# Patient Record
Sex: Female | Born: 1935 | Race: Black or African American | Hispanic: No
Health system: Southern US, Community
[De-identification: ages and names within clinical notes are randomized; demographics above are authoritative.]

---

## 2000-01-20 ENCOUNTER — Inpatient Hospital Stay (HOSPITAL_COMMUNITY): Admission: EM | Admit: 2000-01-20 | Discharge: 2000-01-22 | Payer: Self-pay | Admitting: Cardiology

## 2000-01-21 ENCOUNTER — Encounter: Payer: Self-pay | Admitting: Cardiology

## 2001-02-09 ENCOUNTER — Emergency Department (HOSPITAL_COMMUNITY): Admission: EM | Admit: 2001-02-09 | Discharge: 2001-02-09 | Payer: Self-pay | Admitting: *Deleted

## 2001-05-09 ENCOUNTER — Encounter: Payer: Self-pay | Admitting: Internal Medicine

## 2001-05-09 ENCOUNTER — Emergency Department (HOSPITAL_COMMUNITY): Admission: EM | Admit: 2001-05-09 | Discharge: 2001-05-09 | Payer: Self-pay | Admitting: Internal Medicine

## 2001-07-07 ENCOUNTER — Emergency Department (HOSPITAL_COMMUNITY): Admission: EM | Admit: 2001-07-07 | Discharge: 2001-07-07 | Payer: Self-pay | Admitting: Emergency Medicine

## 2001-07-09 ENCOUNTER — Encounter: Payer: Self-pay | Admitting: Family Medicine

## 2001-07-09 ENCOUNTER — Ambulatory Visit (HOSPITAL_COMMUNITY): Admission: RE | Admit: 2001-07-09 | Discharge: 2001-07-09 | Payer: Self-pay | Admitting: Family Medicine

## 2001-09-22 ENCOUNTER — Encounter: Payer: Self-pay | Admitting: *Deleted

## 2001-09-22 ENCOUNTER — Emergency Department (HOSPITAL_COMMUNITY): Admission: EM | Admit: 2001-09-22 | Discharge: 2001-09-22 | Payer: Self-pay | Admitting: *Deleted

## 2001-10-22 ENCOUNTER — Encounter: Payer: Self-pay | Admitting: *Deleted

## 2001-10-22 ENCOUNTER — Emergency Department (HOSPITAL_COMMUNITY): Admission: EM | Admit: 2001-10-22 | Discharge: 2001-10-22 | Payer: Self-pay | Admitting: Emergency Medicine

## 2001-10-22 ENCOUNTER — Emergency Department (HOSPITAL_COMMUNITY): Admission: EM | Admit: 2001-10-22 | Discharge: 2001-10-22 | Payer: Self-pay | Admitting: *Deleted

## 2001-11-06 ENCOUNTER — Emergency Department (HOSPITAL_COMMUNITY): Admission: EM | Admit: 2001-11-06 | Discharge: 2001-11-06 | Payer: Self-pay | Admitting: *Deleted

## 2001-11-08 ENCOUNTER — Emergency Department (HOSPITAL_COMMUNITY): Admission: EM | Admit: 2001-11-08 | Discharge: 2001-11-08 | Payer: Self-pay | Admitting: Emergency Medicine

## 2001-12-26 ENCOUNTER — Emergency Department (HOSPITAL_COMMUNITY): Admission: EM | Admit: 2001-12-26 | Discharge: 2001-12-27 | Payer: Self-pay | Admitting: *Deleted

## 2001-12-27 ENCOUNTER — Encounter: Payer: Self-pay | Admitting: *Deleted

## 2002-11-04 ENCOUNTER — Encounter: Payer: Self-pay | Admitting: Internal Medicine

## 2002-11-04 ENCOUNTER — Emergency Department (HOSPITAL_COMMUNITY): Admission: EM | Admit: 2002-11-04 | Discharge: 2002-11-04 | Payer: Self-pay | Admitting: Internal Medicine

## 2002-11-16 ENCOUNTER — Encounter: Payer: Self-pay | Admitting: Family Medicine

## 2002-11-16 ENCOUNTER — Ambulatory Visit (HOSPITAL_COMMUNITY): Admission: RE | Admit: 2002-11-16 | Discharge: 2002-11-16 | Payer: Self-pay | Admitting: Family Medicine

## 2002-11-24 ENCOUNTER — Encounter: Payer: Self-pay | Admitting: Emergency Medicine

## 2002-11-24 ENCOUNTER — Emergency Department (HOSPITAL_COMMUNITY): Admission: EM | Admit: 2002-11-24 | Discharge: 2002-11-24 | Payer: Self-pay | Admitting: Emergency Medicine

## 2003-01-08 ENCOUNTER — Encounter: Payer: Self-pay | Admitting: Emergency Medicine

## 2003-01-08 ENCOUNTER — Emergency Department (HOSPITAL_COMMUNITY): Admission: EM | Admit: 2003-01-08 | Discharge: 2003-01-08 | Payer: Self-pay | Admitting: Emergency Medicine

## 2003-03-30 ENCOUNTER — Ambulatory Visit (HOSPITAL_COMMUNITY): Admission: RE | Admit: 2003-03-30 | Discharge: 2003-03-31 | Payer: Self-pay | Admitting: Ophthalmology

## 2003-06-09 ENCOUNTER — Ambulatory Visit (HOSPITAL_COMMUNITY): Admission: RE | Admit: 2003-06-09 | Discharge: 2003-06-09 | Payer: Self-pay | Admitting: Ophthalmology

## 2003-08-26 ENCOUNTER — Emergency Department (HOSPITAL_COMMUNITY): Admission: EM | Admit: 2003-08-26 | Discharge: 2003-08-26 | Payer: Self-pay | Admitting: Internal Medicine

## 2003-11-07 ENCOUNTER — Emergency Department (HOSPITAL_COMMUNITY): Admission: EM | Admit: 2003-11-07 | Discharge: 2003-11-07 | Payer: Self-pay | Admitting: *Deleted

## 2003-12-10 ENCOUNTER — Emergency Department (HOSPITAL_COMMUNITY): Admission: EM | Admit: 2003-12-10 | Discharge: 2003-12-10 | Payer: Self-pay | Admitting: Emergency Medicine

## 2003-12-14 ENCOUNTER — Ambulatory Visit (HOSPITAL_COMMUNITY): Admission: RE | Admit: 2003-12-14 | Discharge: 2003-12-14 | Payer: Self-pay | Admitting: Family Medicine

## 2003-12-31 ENCOUNTER — Emergency Department (HOSPITAL_COMMUNITY): Admission: EM | Admit: 2003-12-31 | Discharge: 2003-12-31 | Payer: Self-pay | Admitting: Emergency Medicine

## 2004-01-15 ENCOUNTER — Emergency Department (HOSPITAL_COMMUNITY): Admission: EM | Admit: 2004-01-15 | Discharge: 2004-01-15 | Payer: Self-pay | Admitting: Emergency Medicine

## 2004-03-18 ENCOUNTER — Ambulatory Visit (HOSPITAL_COMMUNITY): Admission: RE | Admit: 2004-03-18 | Discharge: 2004-03-18 | Payer: Self-pay | Admitting: Family Medicine

## 2004-05-06 ENCOUNTER — Inpatient Hospital Stay (HOSPITAL_COMMUNITY): Admission: EM | Admit: 2004-05-06 | Discharge: 2004-05-07 | Payer: Self-pay | Admitting: Emergency Medicine

## 2004-05-08 ENCOUNTER — Ambulatory Visit (HOSPITAL_COMMUNITY): Admission: RE | Admit: 2004-05-08 | Discharge: 2004-05-08 | Payer: Self-pay | Admitting: Ophthalmology

## 2004-06-03 IMAGING — RF DG ESOPHAGUS
11 series · 15 of 15 positions shown · non-contrast
Comparison: none

CLINICAL DATA: Dysphagia; anorexia; wt loss
 ESOPHAGRAM
 There was no evidence of vestibular penetration or aspiration during swallowing.  There is no evidence of stricture or mass involving the cervical or thoracic esophagus.  There is no evidence of hiatal hernia.  The patient swallowed a 13 mm barium tablet successfully which free passed through the esophagus and into the stomach. 
 Mild esophageal dysmotility is noted with break-up in the primary peristaltic wave in the mid to distal esophagus.  Mild gastroesophageal reflux of contrast was also noted.  
 IMPRESSION
 1.  No evidence of esophageal stricture or hiatal hernia.  
 2.  Mild gastroesophageal reflux. 
 3.  Mild esophageal dysmotility.

[Series 1: run · 4 of 4 slices shown (1 of 11)]
[im 1/4]
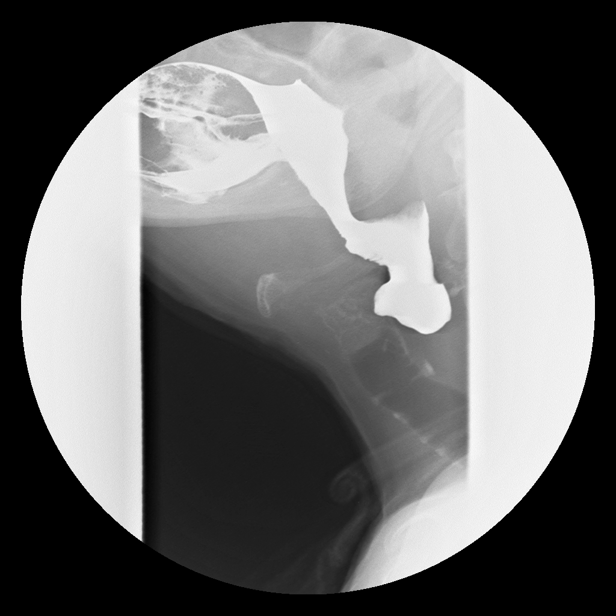
[im 2/4]
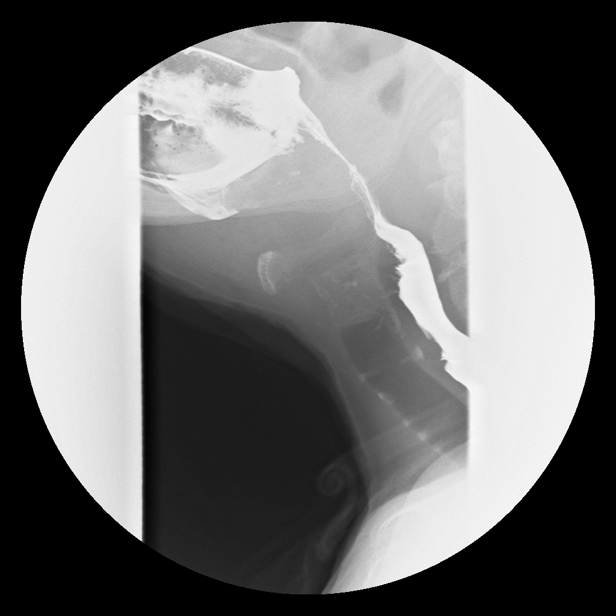
[im 3/4]
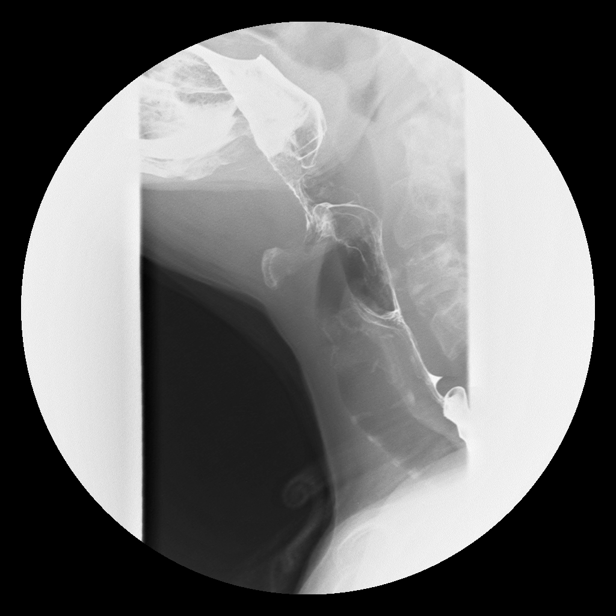
[im 4/4]
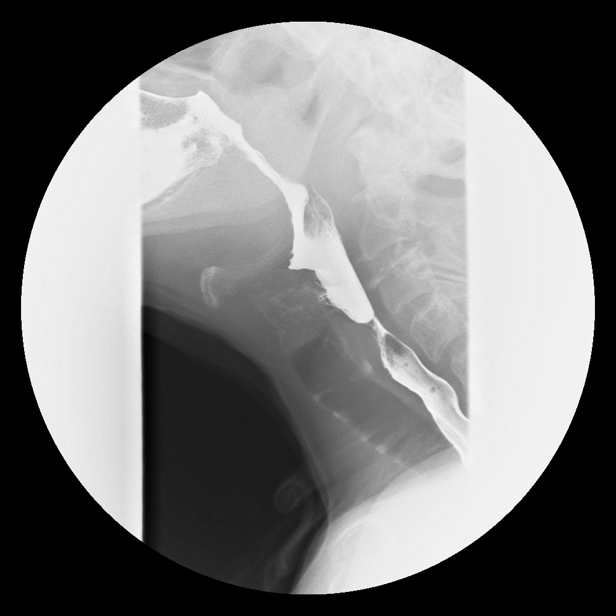

[Series 2: run · 2 of 2 slices shown (2 of 11)]
[im 1/2]
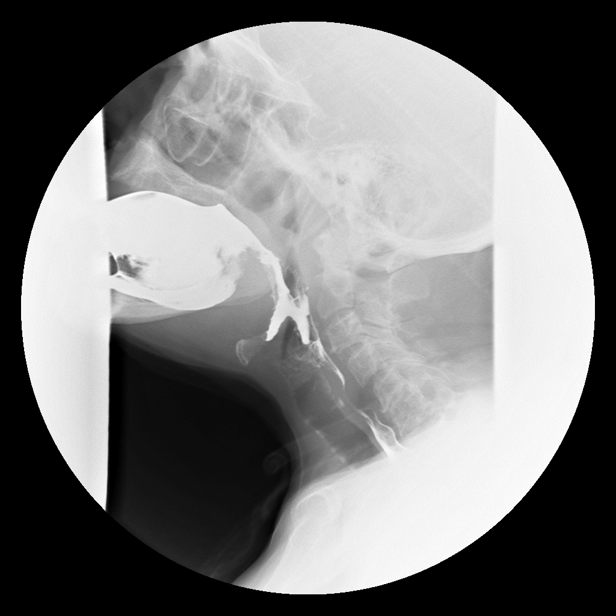
[im 2/2]
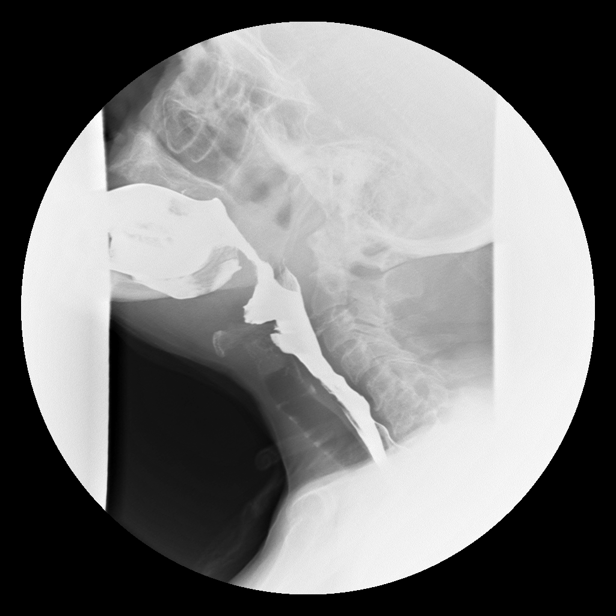

[Series 3: run · 1 of 1 slices shown (3 of 11)]
[im 1/1]
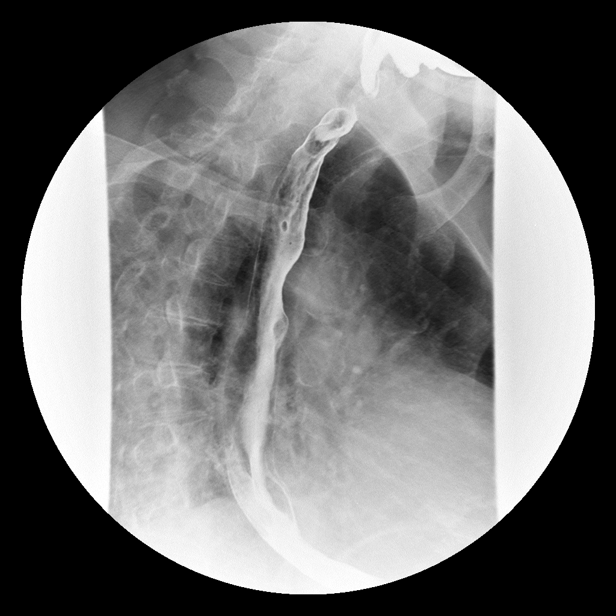

[Series 4: run · 1 of 1 slices shown (4 of 11)]
[im 1/1]
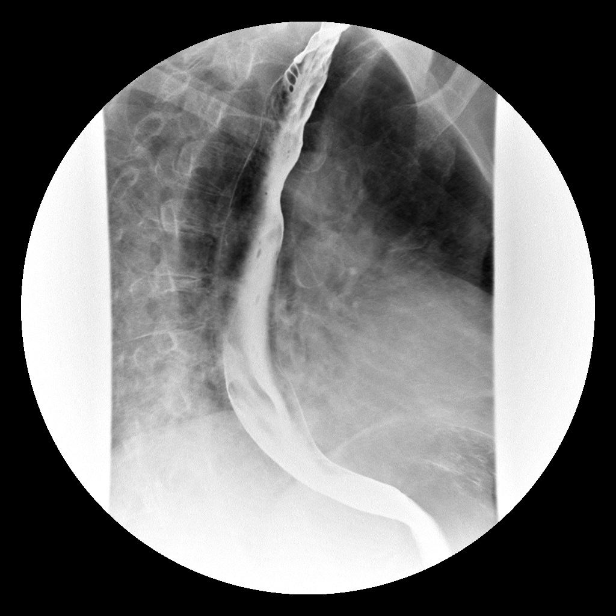

[Series 5: run · 1 of 1 slices shown (5 of 11)]
[im 1/1]
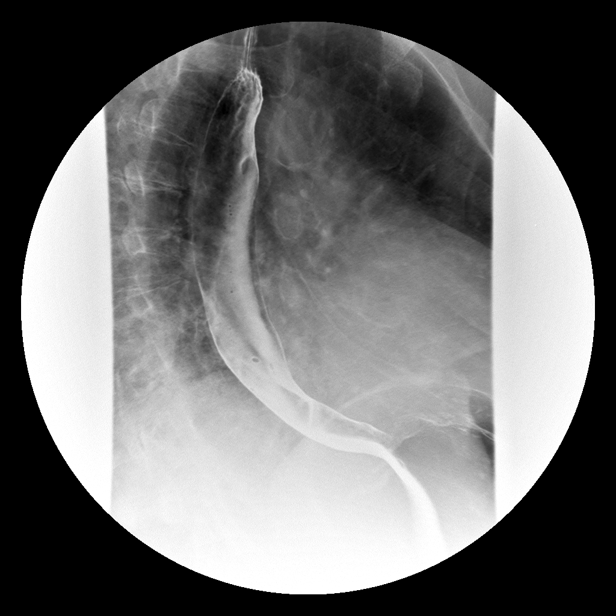

[Series 7: run · 1 of 1 slices shown (6 of 11)]
[im 1/1]
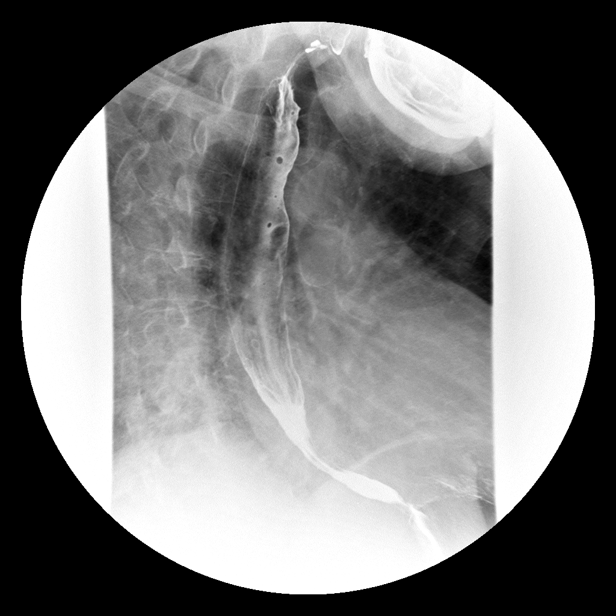

[Series 8: run · 1 of 1 slices shown (7 of 11)]
[im 1/1]
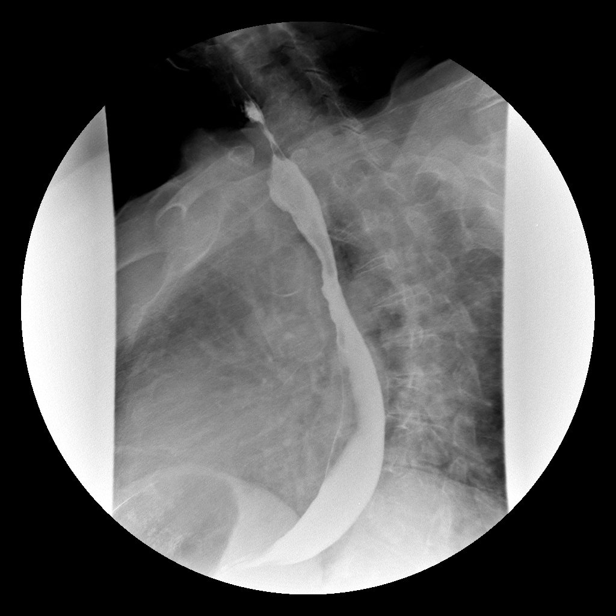

[Series 9: run · 1 of 1 slices shown (8 of 11)]
[im 1/1]
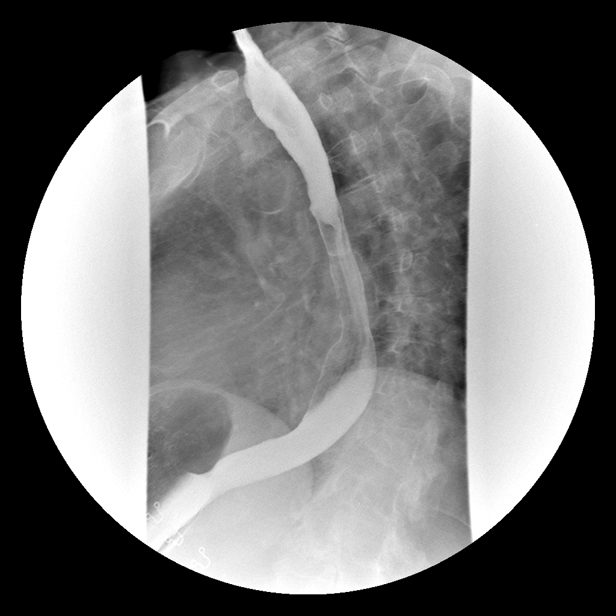

[Series 10: run · 1 of 1 slices shown (9 of 11)]
[im 1/1]
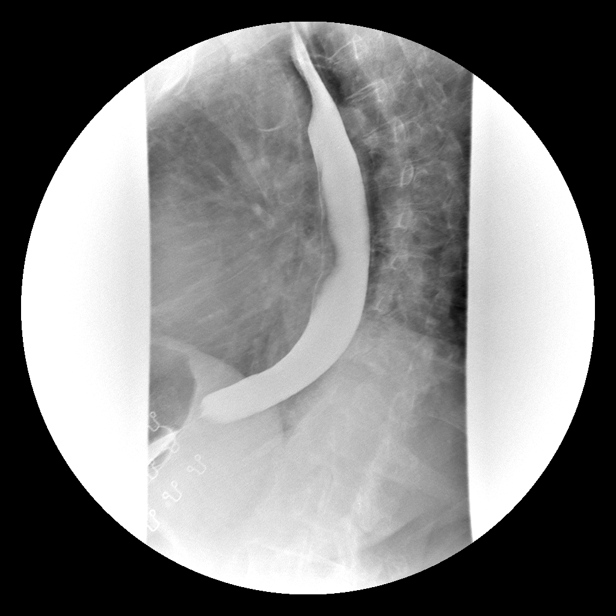

[Series 11: run · 1 of 1 slices shown (10 of 11)]
[im 1/1]
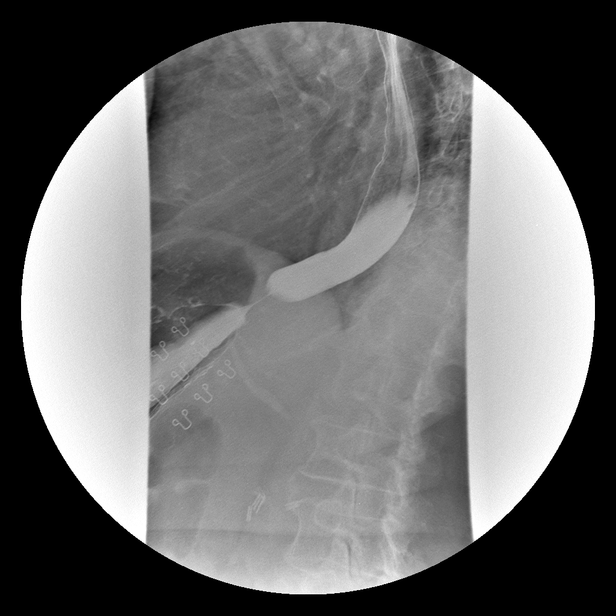

[Series 12: run · 1 of 1 slices shown (11 of 11)]
[im 1/1]
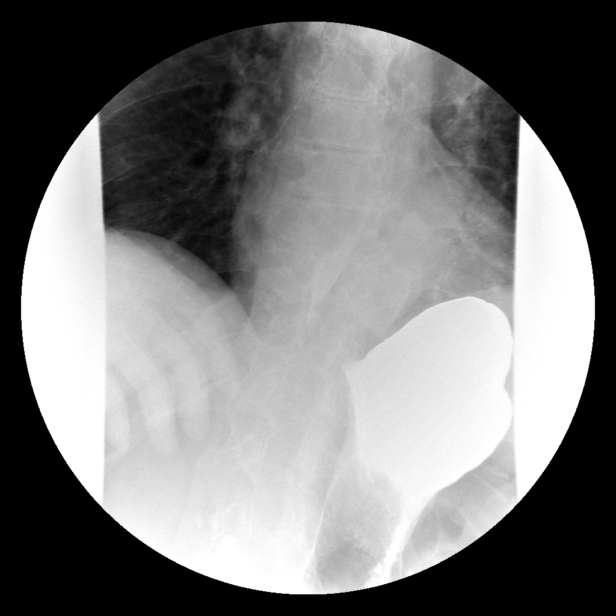

[15 of 15 positions shown; findings below may reference images not displayed]

## 2004-06-05 ENCOUNTER — Emergency Department (HOSPITAL_COMMUNITY): Admission: EM | Admit: 2004-06-05 | Discharge: 2004-06-05 | Payer: Self-pay | Admitting: Emergency Medicine

## 2005-11-11 ENCOUNTER — Ambulatory Visit (HOSPITAL_COMMUNITY): Admission: RE | Admit: 2005-11-11 | Discharge: 2005-11-11 | Payer: Self-pay | Admitting: Family Medicine

## 2005-11-11 ENCOUNTER — Encounter (INDEPENDENT_AMBULATORY_CARE_PROVIDER_SITE_OTHER): Payer: Self-pay | Admitting: Family Medicine

## 2005-12-15 ENCOUNTER — Ambulatory Visit (HOSPITAL_COMMUNITY): Admission: RE | Admit: 2005-12-15 | Discharge: 2005-12-15 | Payer: Self-pay | Admitting: Ophthalmology

## 2006-03-01 ENCOUNTER — Inpatient Hospital Stay (HOSPITAL_COMMUNITY): Admission: EM | Admit: 2006-03-01 | Discharge: 2006-03-05 | Payer: Self-pay | Admitting: Emergency Medicine

## 2006-03-01 ENCOUNTER — Encounter (INDEPENDENT_AMBULATORY_CARE_PROVIDER_SITE_OTHER): Payer: Self-pay | Admitting: Family Medicine

## 2006-03-02 ENCOUNTER — Ambulatory Visit: Payer: Self-pay | Admitting: Cardiology

## 2006-03-02 ENCOUNTER — Encounter (INDEPENDENT_AMBULATORY_CARE_PROVIDER_SITE_OTHER): Payer: Self-pay | Admitting: Family Medicine

## 2006-03-03 ENCOUNTER — Encounter (INDEPENDENT_AMBULATORY_CARE_PROVIDER_SITE_OTHER): Payer: Self-pay | Admitting: Family Medicine

## 2006-03-14 ENCOUNTER — Encounter (INDEPENDENT_AMBULATORY_CARE_PROVIDER_SITE_OTHER): Payer: Self-pay | Admitting: Family Medicine

## 2006-03-27 ENCOUNTER — Encounter (INDEPENDENT_AMBULATORY_CARE_PROVIDER_SITE_OTHER): Payer: Self-pay | Admitting: Family Medicine

## 2006-06-04 ENCOUNTER — Ambulatory Visit: Payer: Self-pay | Admitting: Family Medicine

## 2006-06-29 ENCOUNTER — Encounter (INDEPENDENT_AMBULATORY_CARE_PROVIDER_SITE_OTHER): Payer: Self-pay | Admitting: Family Medicine

## 2006-07-02 ENCOUNTER — Ambulatory Visit: Payer: Self-pay | Admitting: Family Medicine

## 2006-07-22 ENCOUNTER — Encounter (INDEPENDENT_AMBULATORY_CARE_PROVIDER_SITE_OTHER): Payer: Self-pay | Admitting: Family Medicine

## 2006-07-30 ENCOUNTER — Ambulatory Visit: Payer: Self-pay | Admitting: Family Medicine

## 2006-08-27 ENCOUNTER — Ambulatory Visit (HOSPITAL_COMMUNITY): Admission: RE | Admit: 2006-08-27 | Discharge: 2006-08-27 | Payer: Self-pay | Admitting: Cardiology

## 2006-09-05 ENCOUNTER — Encounter (INDEPENDENT_AMBULATORY_CARE_PROVIDER_SITE_OTHER): Payer: Self-pay | Admitting: Family Medicine

## 2006-09-07 ENCOUNTER — Ambulatory Visit: Payer: Self-pay | Admitting: Family Medicine

## 2006-09-28 ENCOUNTER — Emergency Department (HOSPITAL_COMMUNITY): Admission: EM | Admit: 2006-09-28 | Discharge: 2006-09-28 | Payer: Self-pay | Admitting: Emergency Medicine

## 2006-10-02 ENCOUNTER — Encounter (INDEPENDENT_AMBULATORY_CARE_PROVIDER_SITE_OTHER): Payer: Self-pay | Admitting: Family Medicine

## 2006-10-11 ENCOUNTER — Encounter: Payer: Self-pay | Admitting: Family Medicine

## 2006-10-11 DIAGNOSIS — I10 Essential (primary) hypertension: Secondary | ICD-10-CM | POA: Insufficient documentation

## 2006-10-11 DIAGNOSIS — K219 Gastro-esophageal reflux disease without esophagitis: Secondary | ICD-10-CM | POA: Insufficient documentation

## 2006-10-11 DIAGNOSIS — J309 Allergic rhinitis, unspecified: Secondary | ICD-10-CM | POA: Insufficient documentation

## 2006-10-11 DIAGNOSIS — F068 Other specified mental disorders due to known physiological condition: Secondary | ICD-10-CM

## 2006-10-12 ENCOUNTER — Encounter (INDEPENDENT_AMBULATORY_CARE_PROVIDER_SITE_OTHER): Payer: Self-pay | Admitting: Family Medicine

## 2006-10-13 ENCOUNTER — Encounter (INDEPENDENT_AMBULATORY_CARE_PROVIDER_SITE_OTHER): Payer: Self-pay | Admitting: Family Medicine

## 2006-11-02 ENCOUNTER — Encounter (INDEPENDENT_AMBULATORY_CARE_PROVIDER_SITE_OTHER): Payer: Self-pay | Admitting: Family Medicine

## 2006-11-10 ENCOUNTER — Ambulatory Visit: Payer: Self-pay | Admitting: Family Medicine

## 2006-11-19 ENCOUNTER — Ambulatory Visit: Payer: Self-pay | Admitting: Family Medicine

## 2006-12-01 ENCOUNTER — Encounter (INDEPENDENT_AMBULATORY_CARE_PROVIDER_SITE_OTHER): Payer: Self-pay | Admitting: Family Medicine

## 2006-12-02 ENCOUNTER — Ambulatory Visit: Payer: Self-pay | Admitting: Family Medicine

## 2006-12-02 DIAGNOSIS — I739 Peripheral vascular disease, unspecified: Secondary | ICD-10-CM

## 2006-12-03 ENCOUNTER — Encounter (INDEPENDENT_AMBULATORY_CARE_PROVIDER_SITE_OTHER): Payer: Self-pay | Admitting: Family Medicine

## 2006-12-03 ENCOUNTER — Telehealth (INDEPENDENT_AMBULATORY_CARE_PROVIDER_SITE_OTHER): Payer: Self-pay | Admitting: Family Medicine

## 2006-12-07 ENCOUNTER — Encounter (INDEPENDENT_AMBULATORY_CARE_PROVIDER_SITE_OTHER): Payer: Self-pay | Admitting: Family Medicine

## 2006-12-10 ENCOUNTER — Encounter (INDEPENDENT_AMBULATORY_CARE_PROVIDER_SITE_OTHER): Payer: Self-pay | Admitting: Family Medicine

## 2006-12-11 ENCOUNTER — Encounter (INDEPENDENT_AMBULATORY_CARE_PROVIDER_SITE_OTHER): Payer: Self-pay | Admitting: Family Medicine

## 2006-12-28 ENCOUNTER — Ambulatory Visit: Payer: Self-pay | Admitting: Family Medicine

## 2006-12-28 LAB — CONVERTED CEMR LAB
Glucose, Bld: 269 mg/dL
Hgb A1c MFr Bld: 6.1 %

## 2007-01-21 ENCOUNTER — Telehealth (INDEPENDENT_AMBULATORY_CARE_PROVIDER_SITE_OTHER): Payer: Self-pay | Admitting: Family Medicine

## 2007-01-26 ENCOUNTER — Encounter (INDEPENDENT_AMBULATORY_CARE_PROVIDER_SITE_OTHER): Payer: Self-pay | Admitting: Family Medicine

## 2007-01-29 ENCOUNTER — Encounter (INDEPENDENT_AMBULATORY_CARE_PROVIDER_SITE_OTHER): Payer: Self-pay | Admitting: Family Medicine

## 2007-02-02 ENCOUNTER — Encounter (INDEPENDENT_AMBULATORY_CARE_PROVIDER_SITE_OTHER): Payer: Self-pay | Admitting: Family Medicine

## 2007-02-04 ENCOUNTER — Ambulatory Visit: Payer: Self-pay | Admitting: Family Medicine

## 2007-02-05 ENCOUNTER — Encounter (INDEPENDENT_AMBULATORY_CARE_PROVIDER_SITE_OTHER): Payer: Self-pay | Admitting: Family Medicine

## 2007-03-03 ENCOUNTER — Encounter (INDEPENDENT_AMBULATORY_CARE_PROVIDER_SITE_OTHER): Payer: Self-pay | Admitting: Family Medicine

## 2007-03-15 ENCOUNTER — Encounter (INDEPENDENT_AMBULATORY_CARE_PROVIDER_SITE_OTHER): Payer: Self-pay | Admitting: Family Medicine

## 2007-03-16 LAB — CONVERTED CEMR LAB
AST: 14 units/L (ref 0–37)
Albumin: 3.9 g/dL (ref 3.5–5.2)
BUN: 27 mg/dL — ABNORMAL HIGH (ref 6–23)
Basophils Relative: 1 % (ref 0–1)
Calcium: 9.5 mg/dL (ref 8.4–10.5)
Chloride: 109 meq/L (ref 96–112)
HDL: 52 mg/dL (ref >39)
Lymphocytes Relative: 27 % (ref 12–46)
Lymphs Abs: 1.5 10*3/uL (ref 0.7–3.3)
MCHC: 34.7 g/dL (ref 30.0–36.0)
Monocytes Relative: 5 % (ref 3–11)
Neutro Abs: 3.5 10*3/uL (ref 1.7–7.7)
Neutrophils Relative %: 62 % (ref 43–77)
Potassium: 4.9 meq/L (ref 3.5–5.3)
RBC: 4.31 M/uL (ref 3.87–5.11)
TSH: 3.62 microintl units/mL (ref 0.350–5.50)
WBC: 5.6 10*3/uL (ref 4.0–10.5)

## 2007-03-18 ENCOUNTER — Encounter (INDEPENDENT_AMBULATORY_CARE_PROVIDER_SITE_OTHER): Payer: Self-pay | Admitting: Family Medicine

## 2007-03-21 ENCOUNTER — Encounter (INDEPENDENT_AMBULATORY_CARE_PROVIDER_SITE_OTHER): Payer: Self-pay | Admitting: Family Medicine

## 2007-03-23 ENCOUNTER — Ambulatory Visit: Payer: Self-pay | Admitting: Family Medicine

## 2007-04-02 ENCOUNTER — Encounter (INDEPENDENT_AMBULATORY_CARE_PROVIDER_SITE_OTHER): Payer: Self-pay | Admitting: Family Medicine

## 2007-04-06 ENCOUNTER — Encounter (INDEPENDENT_AMBULATORY_CARE_PROVIDER_SITE_OTHER): Payer: Self-pay | Admitting: Family Medicine

## 2007-05-03 ENCOUNTER — Encounter (INDEPENDENT_AMBULATORY_CARE_PROVIDER_SITE_OTHER): Payer: Self-pay | Admitting: Family Medicine

## 2007-05-06 ENCOUNTER — Ambulatory Visit: Payer: Self-pay | Admitting: Family Medicine

## 2007-05-06 DIAGNOSIS — N183 Chronic kidney disease, stage 3 unspecified: Secondary | ICD-10-CM | POA: Insufficient documentation

## 2007-05-06 LAB — CONVERTED CEMR LAB
Cholesterol, target level: 200 mg/dL
Hgb A1c MFr Bld: 6.3 %
LDL Goal: 70 mg/dL

## 2007-05-18 ENCOUNTER — Encounter (INDEPENDENT_AMBULATORY_CARE_PROVIDER_SITE_OTHER): Payer: Self-pay | Admitting: Family Medicine

## 2007-05-18 ENCOUNTER — Encounter (INDEPENDENT_AMBULATORY_CARE_PROVIDER_SITE_OTHER): Payer: Self-pay | Admitting: *Deleted

## 2007-06-11 ENCOUNTER — Encounter (INDEPENDENT_AMBULATORY_CARE_PROVIDER_SITE_OTHER): Payer: Self-pay | Admitting: Family Medicine

## 2007-07-19 ENCOUNTER — Encounter (INDEPENDENT_AMBULATORY_CARE_PROVIDER_SITE_OTHER): Payer: Self-pay | Admitting: Family Medicine

## 2007-07-20 ENCOUNTER — Ambulatory Visit: Payer: Self-pay | Admitting: Family Medicine

## 2007-08-05 ENCOUNTER — Ambulatory Visit: Payer: Self-pay | Admitting: Family Medicine

## 2007-08-05 LAB — CONVERTED CEMR LAB

## 2007-08-06 ENCOUNTER — Telehealth (INDEPENDENT_AMBULATORY_CARE_PROVIDER_SITE_OTHER): Payer: Self-pay | Admitting: *Deleted

## 2007-08-06 LAB — CONVERTED CEMR LAB
ALT: <8 units/L (ref 0–35)
AST: 12 units/L (ref 0–37)
Basophils Absolute: 0 10*3/uL (ref 0.0–0.1)
Basophils Relative: 1 % (ref 0–1)
Creatinine, Ser: 1.31 mg/dL — ABNORMAL HIGH (ref 0.40–1.20)
Eosinophils Relative: 4 % (ref 0–5)
HCT: 36.7 % (ref 36.0–46.0)
Hemoglobin: 12.4 g/dL (ref 12.0–15.0)
MCHC: 33.8 g/dL (ref 30.0–36.0)
Monocytes Absolute: 0.4 10*3/uL (ref 0.2–0.7)
RDW: 14.5 % — ABNORMAL HIGH (ref 11.5–14.0)
Total Bilirubin: 0.7 mg/dL (ref 0.3–1.2)

## 2007-09-14 ENCOUNTER — Encounter (INDEPENDENT_AMBULATORY_CARE_PROVIDER_SITE_OTHER): Payer: Self-pay | Admitting: Family Medicine

## 2007-09-20 ENCOUNTER — Encounter (INDEPENDENT_AMBULATORY_CARE_PROVIDER_SITE_OTHER): Payer: Self-pay | Admitting: Family Medicine

## 2007-09-29 ENCOUNTER — Ambulatory Visit: Payer: Self-pay | Admitting: Family Medicine

## 2007-11-04 ENCOUNTER — Ambulatory Visit: Payer: Self-pay | Admitting: Family Medicine

## 2007-11-04 ENCOUNTER — Telehealth (INDEPENDENT_AMBULATORY_CARE_PROVIDER_SITE_OTHER): Payer: Self-pay | Admitting: *Deleted

## 2007-11-04 LAB — CONVERTED CEMR LAB: Glucose, Bld: 139 mg/dL

## 2007-11-23 ENCOUNTER — Telehealth (INDEPENDENT_AMBULATORY_CARE_PROVIDER_SITE_OTHER): Payer: Self-pay | Admitting: *Deleted

## 2007-11-23 ENCOUNTER — Ambulatory Visit: Payer: Self-pay | Admitting: Family Medicine

## 2007-11-25 ENCOUNTER — Encounter (INDEPENDENT_AMBULATORY_CARE_PROVIDER_SITE_OTHER): Payer: Self-pay | Admitting: Family Medicine

## 2007-11-26 ENCOUNTER — Ambulatory Visit: Payer: Self-pay | Admitting: Family Medicine

## 2007-12-08 ENCOUNTER — Encounter (INDEPENDENT_AMBULATORY_CARE_PROVIDER_SITE_OTHER): Payer: Self-pay | Admitting: Family Medicine

## 2007-12-24 ENCOUNTER — Encounter (INDEPENDENT_AMBULATORY_CARE_PROVIDER_SITE_OTHER): Payer: Self-pay | Admitting: Family Medicine

## 2008-01-05 ENCOUNTER — Telehealth (INDEPENDENT_AMBULATORY_CARE_PROVIDER_SITE_OTHER): Payer: Self-pay | Admitting: Family Medicine

## 2008-01-06 ENCOUNTER — Encounter (INDEPENDENT_AMBULATORY_CARE_PROVIDER_SITE_OTHER): Payer: Self-pay | Admitting: Family Medicine

## 2008-01-10 ENCOUNTER — Encounter (INDEPENDENT_AMBULATORY_CARE_PROVIDER_SITE_OTHER): Payer: Self-pay | Admitting: Family Medicine

## 2008-01-11 ENCOUNTER — Ambulatory Visit: Payer: Self-pay | Admitting: Family Medicine

## 2008-02-01 ENCOUNTER — Encounter (INDEPENDENT_AMBULATORY_CARE_PROVIDER_SITE_OTHER): Payer: Self-pay | Admitting: Family Medicine

## 2008-02-02 ENCOUNTER — Ambulatory Visit: Payer: Self-pay | Admitting: Family Medicine

## 2008-02-02 LAB — CONVERTED CEMR LAB
Glucose, Bld: 99 mg/dL
Hgb A1c MFr Bld: 6.7 %

## 2008-02-25 ENCOUNTER — Encounter (INDEPENDENT_AMBULATORY_CARE_PROVIDER_SITE_OTHER): Payer: Self-pay | Admitting: Family Medicine

## 2008-04-05 ENCOUNTER — Encounter (INDEPENDENT_AMBULATORY_CARE_PROVIDER_SITE_OTHER): Payer: Self-pay | Admitting: Family Medicine

## 2008-05-02 ENCOUNTER — Ambulatory Visit: Payer: Self-pay | Admitting: Family Medicine

## 2008-05-02 DIAGNOSIS — E119 Type 2 diabetes mellitus without complications: Secondary | ICD-10-CM

## 2008-05-02 LAB — CONVERTED CEMR LAB: Blood Glucose, Fingerstick: 185

## 2008-05-03 ENCOUNTER — Ambulatory Visit: Payer: Self-pay | Admitting: Family Medicine

## 2008-05-11 ENCOUNTER — Encounter (INDEPENDENT_AMBULATORY_CARE_PROVIDER_SITE_OTHER): Payer: Self-pay | Admitting: Family Medicine

## 2008-05-12 ENCOUNTER — Encounter (INDEPENDENT_AMBULATORY_CARE_PROVIDER_SITE_OTHER): Payer: Self-pay | Admitting: Family Medicine

## 2008-05-12 LAB — CONVERTED CEMR LAB
AST: 15 units/L (ref 0–37)
Albumin: 4.1 g/dL (ref 3.5–5.2)
Alkaline Phosphatase: 123 units/L — ABNORMAL HIGH (ref 39–117)
Basophils Relative: 1 % (ref 0–1)
Eosinophils Absolute: 0.2 10*3/uL (ref 0.0–0.7)
LDL Cholesterol: 102 mg/dL — ABNORMAL HIGH (ref 0–99)
Lymphs Abs: 1.6 10*3/uL (ref 0.7–4.0)
MCHC: 34.9 g/dL (ref 30.0–36.0)
MCV: 82.1 fL (ref 78.0–100.0)
Neutrophils Relative %: 66 % (ref 43–77)
Platelets: 201 10*3/uL (ref 150–400)
Potassium: 4 meq/L (ref 3.5–5.3)
Sodium: 144 meq/L (ref 135–145)
TSH: 3.777 microintl units/mL (ref 0.350–4.50)
Total Protein: 7.2 g/dL (ref 6.0–8.3)
WBC: 6.2 10*3/uL (ref 4.0–10.5)

## 2008-05-31 ENCOUNTER — Encounter (INDEPENDENT_AMBULATORY_CARE_PROVIDER_SITE_OTHER): Payer: Self-pay | Admitting: Family Medicine

## 2008-06-14 ENCOUNTER — Emergency Department (HOSPITAL_COMMUNITY): Admission: EM | Admit: 2008-06-14 | Discharge: 2008-06-14 | Payer: Self-pay | Admitting: Emergency Medicine

## 2008-06-19 ENCOUNTER — Ambulatory Visit: Payer: Self-pay | Admitting: Family Medicine

## 2008-07-14 ENCOUNTER — Telehealth (INDEPENDENT_AMBULATORY_CARE_PROVIDER_SITE_OTHER): Payer: Self-pay | Admitting: *Deleted

## 2008-07-25 ENCOUNTER — Ambulatory Visit: Payer: Self-pay | Admitting: Family Medicine

## 2008-09-13 ENCOUNTER — Ambulatory Visit: Payer: Self-pay | Admitting: Family Medicine

## 2008-09-19 ENCOUNTER — Encounter (INDEPENDENT_AMBULATORY_CARE_PROVIDER_SITE_OTHER): Payer: Self-pay | Admitting: Family Medicine

## 2008-09-27 ENCOUNTER — Encounter (INDEPENDENT_AMBULATORY_CARE_PROVIDER_SITE_OTHER): Payer: Self-pay | Admitting: Family Medicine

## 2008-11-09 ENCOUNTER — Encounter (INDEPENDENT_AMBULATORY_CARE_PROVIDER_SITE_OTHER): Payer: Self-pay | Admitting: Family Medicine

## 2008-11-17 ENCOUNTER — Ambulatory Visit: Payer: Self-pay | Admitting: Family Medicine

## 2008-12-03 ENCOUNTER — Encounter (INDEPENDENT_AMBULATORY_CARE_PROVIDER_SITE_OTHER): Payer: Self-pay | Admitting: Family Medicine

## 2008-12-04 ENCOUNTER — Ambulatory Visit: Payer: Self-pay | Admitting: Family Medicine

## 2008-12-04 DIAGNOSIS — E785 Hyperlipidemia, unspecified: Secondary | ICD-10-CM | POA: Insufficient documentation

## 2008-12-04 LAB — CONVERTED CEMR LAB: Hgb A1c MFr Bld: 8.1 %

## 2008-12-05 LAB — CONVERTED CEMR LAB
ALT: <8 units/L (ref 0–35)
Albumin: 4.1 g/dL (ref 3.5–5.2)
Alkaline Phosphatase: 162 units/L — ABNORMAL HIGH (ref 39–117)
Eosinophils Absolute: 0.4 10*3/uL (ref 0.0–0.7)
Glucose, Bld: 144 mg/dL — ABNORMAL HIGH (ref 70–99)
Lymphocytes Relative: 19 % (ref 12–46)
Lymphs Abs: 1.9 10*3/uL (ref 0.7–4.0)
Neutrophils Relative %: 72 % (ref 43–77)
Platelets: 240 10*3/uL (ref 150–400)
Potassium: 4.6 meq/L (ref 3.5–5.3)
Sodium: 144 meq/L (ref 135–145)
Total Protein: 7.5 g/dL (ref 6.0–8.3)
WBC: 9.9 10*3/uL (ref 4.0–10.5)

## 2008-12-08 ENCOUNTER — Telehealth (INDEPENDENT_AMBULATORY_CARE_PROVIDER_SITE_OTHER): Payer: Self-pay | Admitting: Family Medicine

## 2008-12-08 ENCOUNTER — Encounter (INDEPENDENT_AMBULATORY_CARE_PROVIDER_SITE_OTHER): Payer: Self-pay | Admitting: Family Medicine

## 2008-12-11 ENCOUNTER — Encounter (INDEPENDENT_AMBULATORY_CARE_PROVIDER_SITE_OTHER): Payer: Self-pay | Admitting: Family Medicine

## 2008-12-12 ENCOUNTER — Encounter (INDEPENDENT_AMBULATORY_CARE_PROVIDER_SITE_OTHER): Payer: Self-pay | Admitting: Family Medicine

## 2008-12-22 ENCOUNTER — Encounter (INDEPENDENT_AMBULATORY_CARE_PROVIDER_SITE_OTHER): Payer: Self-pay | Admitting: Family Medicine

## 2009-01-15 ENCOUNTER — Encounter (INDEPENDENT_AMBULATORY_CARE_PROVIDER_SITE_OTHER): Payer: Self-pay | Admitting: Family Medicine

## 2009-01-23 ENCOUNTER — Ambulatory Visit: Payer: Self-pay | Admitting: Family Medicine

## 2009-03-05 ENCOUNTER — Ambulatory Visit (HOSPITAL_COMMUNITY): Admission: RE | Admit: 2009-03-05 | Discharge: 2009-03-05 | Payer: Self-pay | Admitting: Family Medicine

## 2009-03-05 ENCOUNTER — Ambulatory Visit: Payer: Self-pay | Admitting: Family Medicine

## 2009-03-05 DIAGNOSIS — R05 Cough: Secondary | ICD-10-CM

## 2009-03-05 LAB — CONVERTED CEMR LAB: Glucose, Bld: 110 mg/dL

## 2009-03-06 ENCOUNTER — Encounter (INDEPENDENT_AMBULATORY_CARE_PROVIDER_SITE_OTHER): Payer: Self-pay | Admitting: *Deleted

## 2009-03-06 LAB — CONVERTED CEMR LAB
BUN: 14 mg/dL (ref 6–23)
CO2: 24 meq/L (ref 19–32)
Chloride: 108 meq/L (ref 96–112)
Creatinine, Ser: 1.22 mg/dL — ABNORMAL HIGH (ref 0.40–1.20)
Glucose, Bld: 159 mg/dL — ABNORMAL HIGH (ref 70–99)

## 2009-04-02 ENCOUNTER — Encounter (INDEPENDENT_AMBULATORY_CARE_PROVIDER_SITE_OTHER): Payer: Self-pay | Admitting: Family Medicine

## 2009-06-04 ENCOUNTER — Ambulatory Visit: Payer: Self-pay | Admitting: Family Medicine

## 2009-06-04 LAB — CONVERTED CEMR LAB: Hgb A1c MFr Bld: 6.2 %

## 2009-06-11 ENCOUNTER — Ambulatory Visit: Payer: Self-pay | Admitting: Family Medicine

## 2009-06-25 ENCOUNTER — Ambulatory Visit: Payer: Self-pay | Admitting: Family Medicine

## 2009-06-26 ENCOUNTER — Encounter (INDEPENDENT_AMBULATORY_CARE_PROVIDER_SITE_OTHER): Payer: Self-pay | Admitting: Family Medicine

## 2009-07-19 ENCOUNTER — Ambulatory Visit: Payer: Self-pay | Admitting: Family Medicine

## 2010-05-13 ENCOUNTER — Inpatient Hospital Stay (HOSPITAL_COMMUNITY): Admission: EM | Admit: 2010-05-13 | Discharge: 2010-05-16 | Payer: Self-pay | Admitting: Emergency Medicine

## 2010-05-13 ENCOUNTER — Ambulatory Visit: Payer: Self-pay | Admitting: Cardiology

## 2010-05-14 ENCOUNTER — Encounter (INDEPENDENT_AMBULATORY_CARE_PROVIDER_SITE_OTHER): Payer: Self-pay | Admitting: Internal Medicine

## 2010-05-27 ENCOUNTER — Emergency Department (HOSPITAL_COMMUNITY): Admission: EM | Admit: 2010-05-27 | Discharge: 2010-05-27 | Payer: Self-pay | Admitting: Emergency Medicine

## 2010-06-14 ENCOUNTER — Inpatient Hospital Stay (HOSPITAL_COMMUNITY): Admission: EM | Admit: 2010-06-14 | Discharge: 2010-06-18 | Payer: Self-pay | Admitting: Emergency Medicine

## 2010-07-27 DEATH — deceased

## 2010-11-24 LAB — CONVERTED CEMR LAB: RBC count: 4.54 10*6/uL

## 2010-12-03 IMAGING — CR DG CHEST 1V PORT
1 series · 1 of 1 positions shown · non-contrast
Comparison: Portable chest x-ray of 05/27/2010

CLINICAL DATA: Rapid heart rate, some shortness of breath

PORTABLE CHEST - 1 VIEW

[view not recorded]
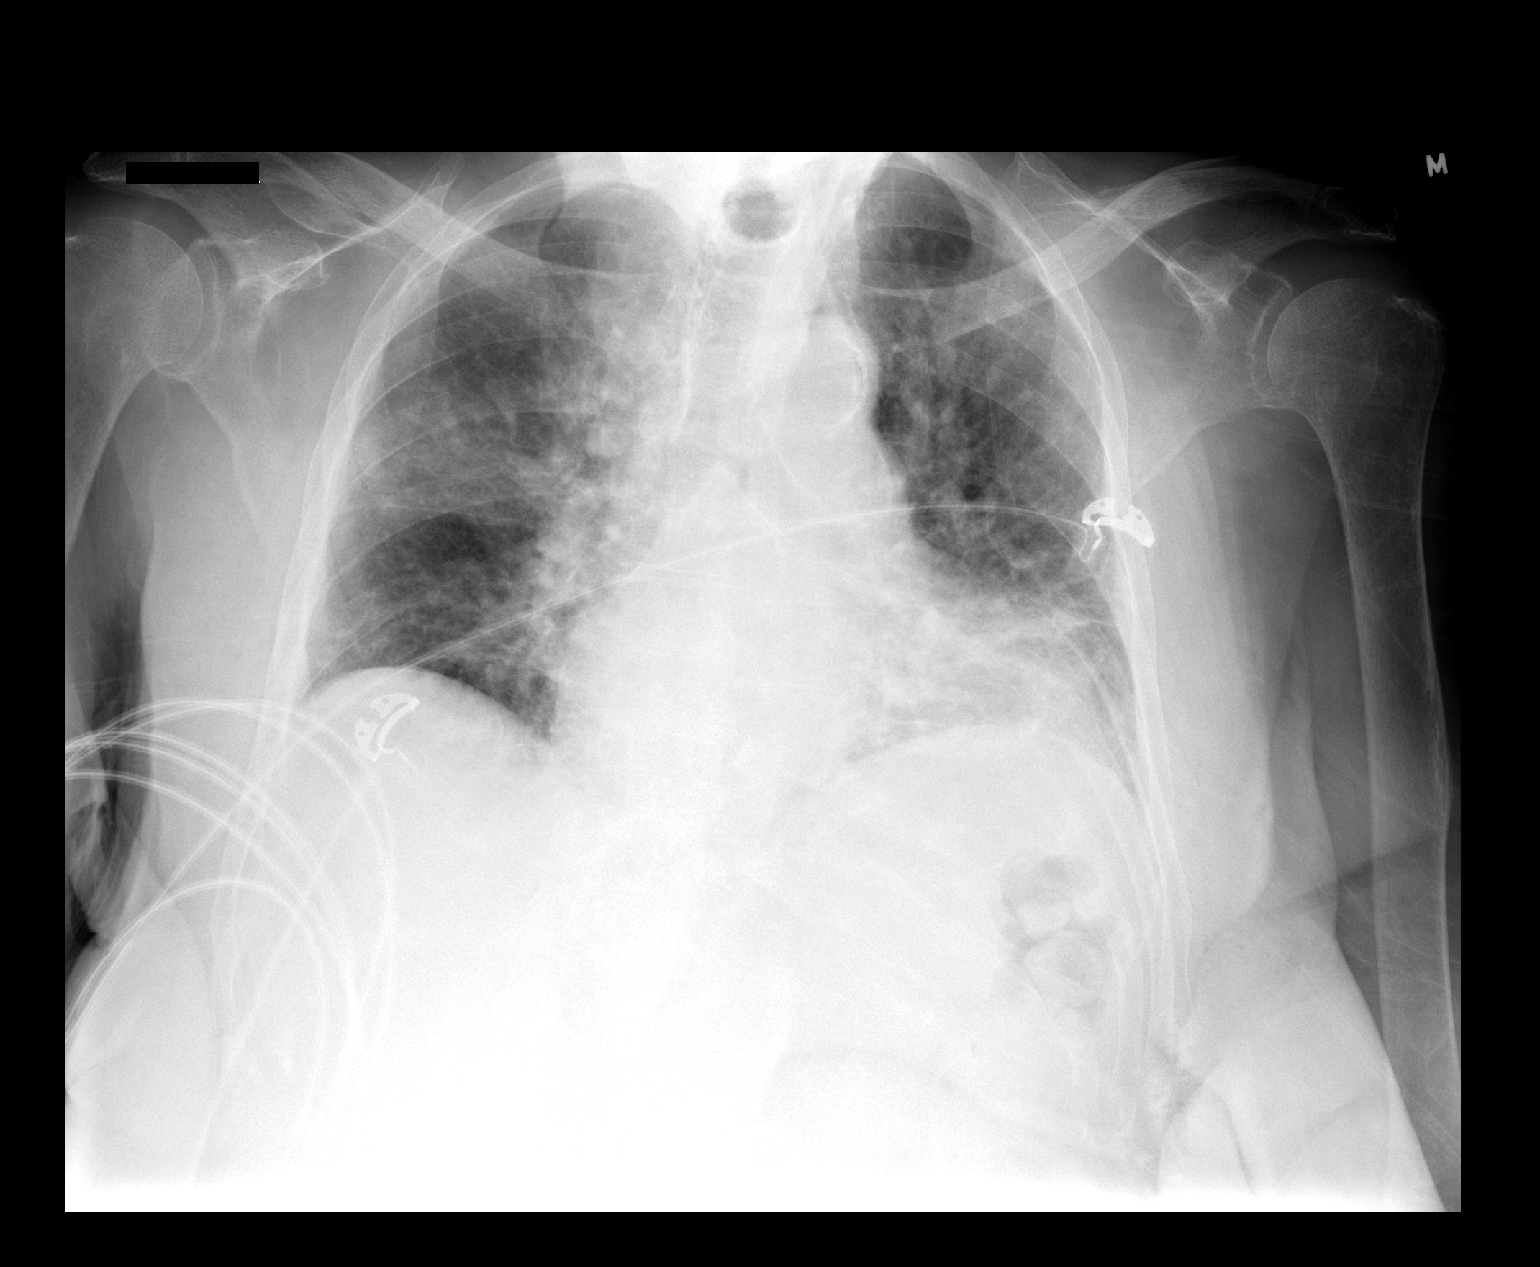

[1 of 1 positions shown; findings below may reference images not displayed]

FINDINGS: There is patchy airspace disease bilaterally which may
represent pneumonia with edema less likely.  Mild cardiomegaly is
present.  No effusion is seen.  No bony abnormality noted.
IMPRESSION: Patchy airspace disease bilaterally most likely representing
pneumonia.  Recommend follow-up.  Mild cardiomegaly.

## 2010-12-04 IMAGING — CR DG CHEST 1V PORT
1 series · 1 of 1 positions shown · non-contrast
Comparison: Chest x-ray 06/14/2010

CLINICAL DATA: Tachycardia.

PORTABLE CHEST - 1 VIEW

[view not recorded]
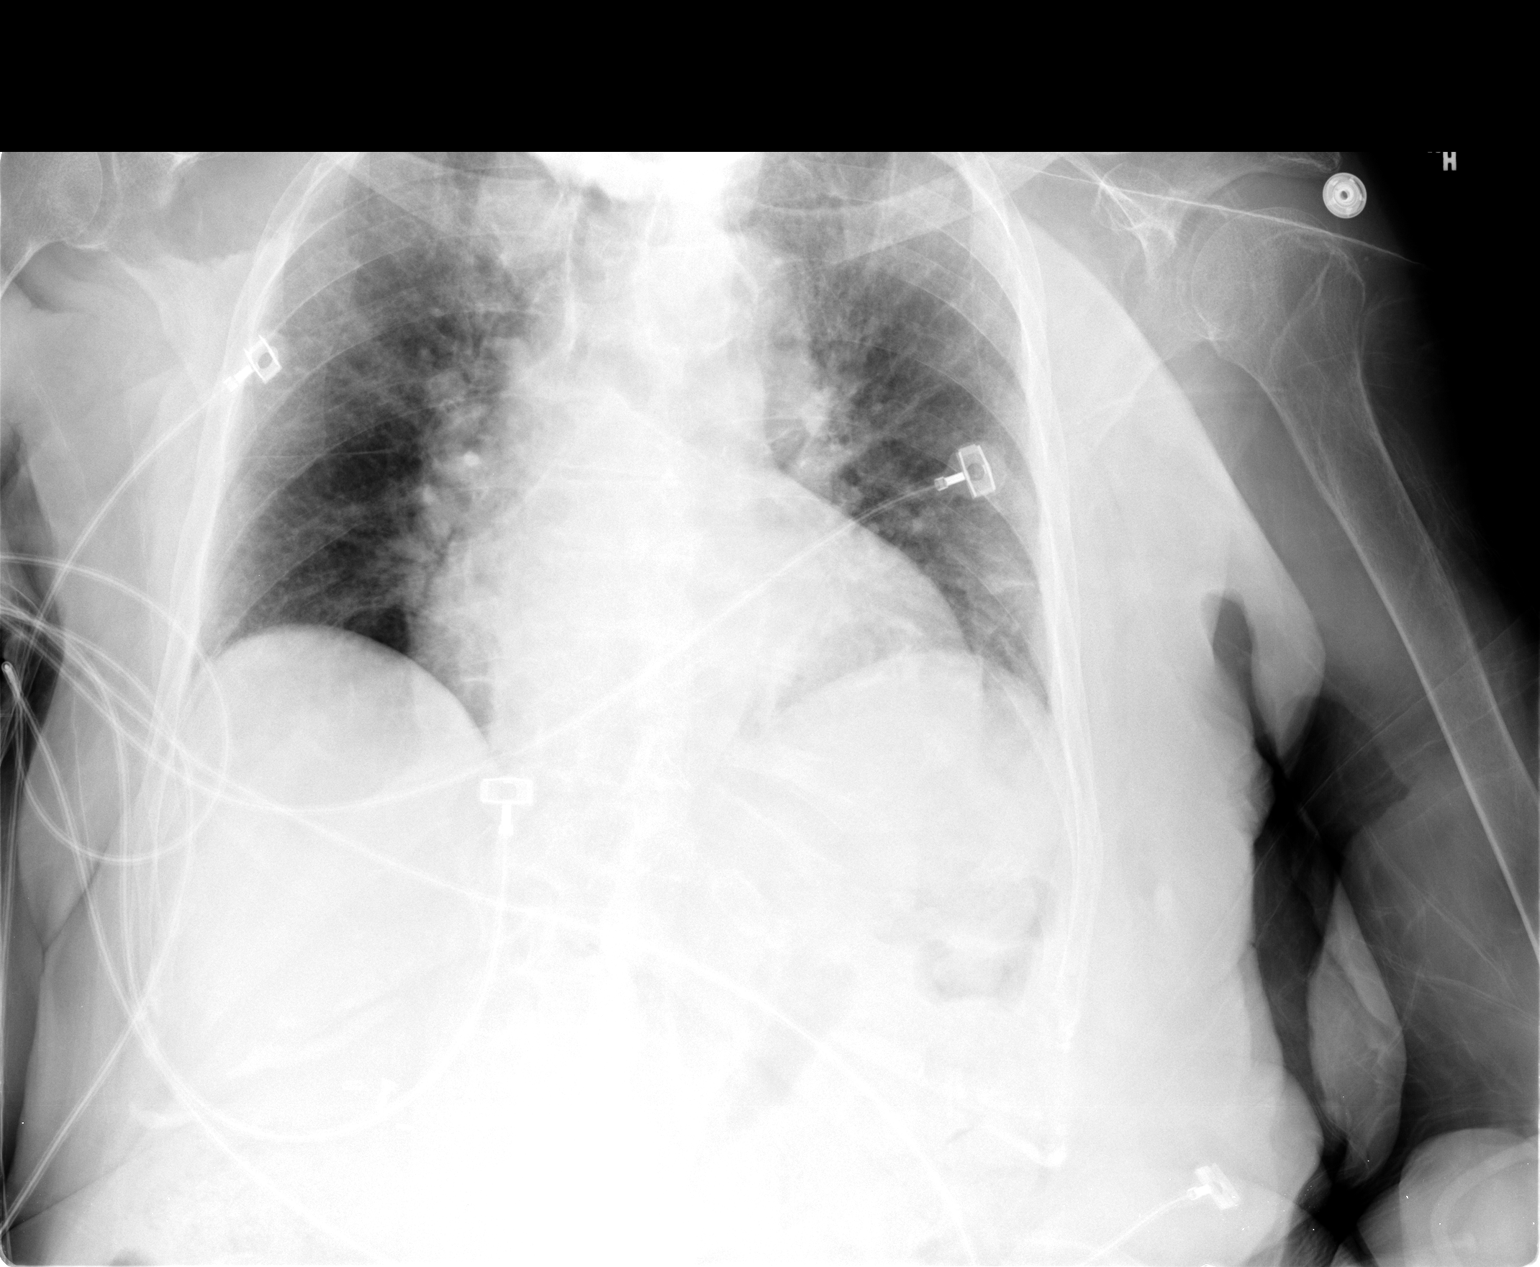

[1 of 1 positions shown; findings below may reference images not displayed]

FINDINGS: The lungs demonstrate slight improved aeration with
resolving areas of atelectasis and decrease in vascular congestion.
IMPRESSION: Improved lung aeration.

## 2011-01-09 LAB — CULTURE, BLOOD (ROUTINE X 2)
Culture: NO GROWTH
Culture: NO GROWTH
Report Status: 8242011
Report Status: 8242011

## 2011-01-09 LAB — DIFFERENTIAL
Basophils Relative: 0 % (ref 0–1)
Eosinophils Absolute: 0.2 10*3/uL (ref 0.0–0.7)
Eosinophils Relative: 2 % (ref 0–5)
Lymphocytes Relative: 32 % (ref 12–46)
Lymphs Abs: 2.1 10*3/uL (ref 0.7–4.0)
Lymphs Abs: 2.1 10*3/uL (ref 0.7–4.0)
Lymphs Abs: 2.2 10*3/uL (ref 0.7–4.0)
Monocytes Absolute: 0.4 10*3/uL (ref 0.1–1.0)
Monocytes Absolute: 0.4 10*3/uL (ref 0.1–1.0)
Monocytes Relative: 6 % (ref 3–12)
Monocytes Relative: 6 % (ref 3–12)
Neutro Abs: 3.7 10*3/uL (ref 1.7–7.7)
Neutro Abs: 3.8 10*3/uL (ref 1.7–7.7)
Neutrophils Relative %: 57 % (ref 43–77)
Neutrophils Relative %: 58 % (ref 43–77)

## 2011-01-09 LAB — GLUCOSE, CAPILLARY
Glucose-Capillary: 102 mg/dL — ABNORMAL HIGH (ref 70–99)
Glucose-Capillary: 139 mg/dL — ABNORMAL HIGH (ref 70–99)
Glucose-Capillary: 161 mg/dL — ABNORMAL HIGH (ref 70–99)
Glucose-Capillary: 164 mg/dL — ABNORMAL HIGH (ref 70–99)
Glucose-Capillary: 165 mg/dL — ABNORMAL HIGH (ref 70–99)
Glucose-Capillary: 179 mg/dL — ABNORMAL HIGH (ref 70–99)

## 2011-01-09 LAB — URINALYSIS, ROUTINE W REFLEX MICROSCOPIC
Bilirubin Urine: NEGATIVE
Glucose, UA: NEGATIVE mg/dL
Hgb urine dipstick: NEGATIVE
Nitrite: NEGATIVE
Specific Gravity, Urine: 1.01 (ref 1.005–1.030)
pH: 6 (ref 5.0–8.0)

## 2011-01-09 LAB — CBC
HCT: 35.9 % — ABNORMAL LOW (ref 36.0–46.0)
HCT: 37.3 % (ref 36.0–46.0)
Hemoglobin: 12 g/dL (ref 12.0–15.0)
Hemoglobin: 12.7 g/dL (ref 12.0–15.0)
MCH: 27.8 pg (ref 26.0–34.0)
MCHC: 34 g/dL (ref 30.0–36.0)
MCV: 83.4 fL (ref 78.0–100.0)
Platelets: 153 10*3/uL (ref 150–400)
RBC: 4.31 MIL/uL (ref 3.87–5.11)
RBC: 4.43 MIL/uL (ref 3.87–5.11)
RBC: 4.46 MIL/uL (ref 3.87–5.11)
WBC: 6.5 10*3/uL (ref 4.0–10.5)

## 2011-01-09 LAB — BASIC METABOLIC PANEL
CO2: 22 mEq/L (ref 19–32)
CO2: 22 mEq/L (ref 19–32)
Calcium: 8.6 mg/dL (ref 8.4–10.5)
Calcium: 9 mg/dL (ref 8.4–10.5)
Creatinine, Ser: 1.29 mg/dL — ABNORMAL HIGH (ref 0.4–1.2)
Creatinine, Ser: 1.52 mg/dL — ABNORMAL HIGH (ref 0.4–1.2)
GFR calc Af Amer: 39 mL/min — ABNORMAL LOW (ref >60)
GFR calc Af Amer: 39 mL/min — ABNORMAL LOW (ref >60)
GFR calc Af Amer: 47 mL/min — ABNORMAL LOW (ref >60)
GFR calc non Af Amer: 32 mL/min — ABNORMAL LOW (ref >60)
Glucose, Bld: 145 mg/dL — ABNORMAL HIGH (ref 70–99)
Glucose, Bld: 176 mg/dL — ABNORMAL HIGH (ref 70–99)
Potassium: 4.1 mEq/L (ref 3.5–5.1)
Sodium: 139 mEq/L (ref 135–145)
Sodium: 144 mEq/L (ref 135–145)

## 2011-01-09 LAB — CARDIAC PANEL(CRET KIN+CKTOT+MB+TROPI)
CK, MB: 1.8 ng/mL (ref 0.3–4.0)
Relative Index: INVALID (ref 0.0–2.5)
Relative Index: INVALID (ref 0.0–2.5)
Troponin I: 0.1 ng/mL — ABNORMAL HIGH (ref 0.00–0.06)

## 2011-01-09 LAB — LIPID PANEL
Total CHOL/HDL Ratio: 2.5 RATIO
VLDL: 15 mg/dL (ref 0–40)

## 2011-01-09 LAB — PROTIME-INR
INR: 1.61 — ABNORMAL HIGH (ref 0.00–1.49)
INR: 2.81 — ABNORMAL HIGH (ref 0.00–1.49)
Prothrombin Time: 19.3 seconds — ABNORMAL HIGH (ref 11.6–15.2)
Prothrombin Time: 20.6 seconds — ABNORMAL HIGH (ref 11.6–15.2)
Prothrombin Time: 29.7 seconds — ABNORMAL HIGH (ref 11.6–15.2)

## 2011-01-09 LAB — PHOSPHORUS: Phosphorus: 2.4 mg/dL (ref 2.3–4.6)

## 2011-01-09 LAB — MRSA PCR SCREENING: MRSA by PCR: NEGATIVE

## 2011-01-09 LAB — PROCALCITONIN: Procalcitonin: <0.5 ng/mL

## 2011-01-10 LAB — COMPREHENSIVE METABOLIC PANEL
AST: 20 U/L (ref 0–37)
Albumin: 3.3 g/dL — ABNORMAL LOW (ref 3.5–5.2)
Alkaline Phosphatase: 84 U/L (ref 39–117)
BUN: 16 mg/dL (ref 6–23)
CO2: 25 mEq/L (ref 19–32)
Chloride: 108 mEq/L (ref 96–112)
Creatinine, Ser: 1.45 mg/dL — ABNORMAL HIGH (ref 0.4–1.2)
GFR calc Af Amer: 41 mL/min — ABNORMAL LOW (ref >60)
GFR calc non Af Amer: 34 mL/min — ABNORMAL LOW (ref >60)
Potassium: 3.6 mEq/L (ref 3.5–5.1)
Total Bilirubin: 0.8 mg/dL (ref 0.3–1.2)

## 2011-01-10 LAB — POCT CARDIAC MARKERS: Troponin i, poc: <0.05 ng/mL (ref 0.00–0.09)

## 2011-01-10 LAB — DIFFERENTIAL
Basophils Absolute: 0 10*3/uL (ref 0.0–0.1)
Basophils Relative: 1 % (ref 0–1)
Eosinophils Relative: 2 % (ref 0–5)
Monocytes Absolute: 0.4 10*3/uL (ref 0.1–1.0)

## 2011-01-10 LAB — CBC
Hemoglobin: 12.5 g/dL (ref 12.0–15.0)
MCH: 28.3 pg (ref 26.0–34.0)
MCV: 83.8 fL (ref 78.0–100.0)
Platelets: 289 10*3/uL (ref 150–400)
RBC: 4.43 MIL/uL (ref 3.87–5.11)
WBC: 7.8 10*3/uL (ref 4.0–10.5)

## 2011-01-10 LAB — URINALYSIS, ROUTINE W REFLEX MICROSCOPIC
Nitrite: NEGATIVE
Protein, ur: NEGATIVE mg/dL
Specific Gravity, Urine: 1.02 (ref 1.005–1.030)
Urobilinogen, UA: 0.2 mg/dL (ref 0.0–1.0)

## 2011-01-10 LAB — LIPASE, BLOOD: Lipase: 44 U/L (ref 11–59)

## 2011-01-10 LAB — URINE CULTURE

## 2011-01-11 LAB — DIFFERENTIAL
Basophils Absolute: 0 10*3/uL (ref 0.0–0.1)
Basophils Absolute: 0.1 10*3/uL (ref 0.0–0.1)
Basophils Relative: 1 % (ref 0–1)
Eosinophils Absolute: 0 10*3/uL (ref 0.0–0.7)
Eosinophils Absolute: 0.1 10*3/uL (ref 0.0–0.7)
Eosinophils Absolute: 0.3 10*3/uL (ref 0.0–0.7)
Lymphocytes Relative: 16 % (ref 12–46)
Lymphs Abs: 1.1 10*3/uL (ref 0.7–4.0)
Lymphs Abs: 1.8 10*3/uL (ref 0.7–4.0)
Monocytes Relative: 5 % (ref 3–12)
Monocytes Relative: 6 % (ref 3–12)
Neutro Abs: 5.7 10*3/uL (ref 1.7–7.7)
Neutro Abs: 9.3 10*3/uL — ABNORMAL HIGH (ref 1.7–7.7)
Neutrophils Relative %: 69 % (ref 43–77)
Neutrophils Relative %: 78 % — ABNORMAL HIGH (ref 43–77)
Neutrophils Relative %: 84 % — ABNORMAL HIGH (ref 43–77)

## 2011-01-11 LAB — CULTURE, BLOOD (ROUTINE X 2): Report Status: 7232011

## 2011-01-11 LAB — URINALYSIS, ROUTINE W REFLEX MICROSCOPIC
Glucose, UA: NEGATIVE mg/dL
Ketones, ur: NEGATIVE mg/dL
Leukocytes, UA: NEGATIVE
Nitrite: NEGATIVE
Specific Gravity, Urine: 1.015 (ref 1.005–1.030)
pH: 6 (ref 5.0–8.0)

## 2011-01-11 LAB — HEMOGLOBIN A1C: Hgb A1c MFr Bld: 6 % — ABNORMAL HIGH (ref <5.7)

## 2011-01-11 LAB — GLUCOSE, CAPILLARY
Glucose-Capillary: 141 mg/dL — ABNORMAL HIGH (ref 70–99)
Glucose-Capillary: 156 mg/dL — ABNORMAL HIGH (ref 70–99)
Glucose-Capillary: 162 mg/dL — ABNORMAL HIGH (ref 70–99)
Glucose-Capillary: 173 mg/dL — ABNORMAL HIGH (ref 70–99)
Glucose-Capillary: 186 mg/dL — ABNORMAL HIGH (ref 70–99)
Glucose-Capillary: 190 mg/dL — ABNORMAL HIGH (ref 70–99)
Glucose-Capillary: 213 mg/dL — ABNORMAL HIGH (ref 70–99)
Glucose-Capillary: 249 mg/dL — ABNORMAL HIGH (ref 70–99)

## 2011-01-11 LAB — COMPREHENSIVE METABOLIC PANEL
ALT: 11 U/L (ref 0–35)
Albumin: 3.5 g/dL (ref 3.5–5.2)
Calcium: 9.5 mg/dL (ref 8.4–10.5)
Glucose, Bld: 167 mg/dL — ABNORMAL HIGH (ref 70–99)
Sodium: 143 mEq/L (ref 135–145)
Total Protein: 7.2 g/dL (ref 6.0–8.3)

## 2011-01-11 LAB — BASIC METABOLIC PANEL
BUN: 23 mg/dL (ref 6–23)
CO2: 22 mEq/L (ref 19–32)
CO2: 27 mEq/L (ref 19–32)
Calcium: 8.8 mg/dL (ref 8.4–10.5)
Calcium: 8.8 mg/dL (ref 8.4–10.5)
Creatinine, Ser: 1.43 mg/dL — ABNORMAL HIGH (ref 0.4–1.2)
Creatinine, Ser: 1.48 mg/dL — ABNORMAL HIGH (ref 0.4–1.2)
GFR calc Af Amer: 40 mL/min — ABNORMAL LOW (ref >60)
GFR calc non Af Amer: 33 mL/min — ABNORMAL LOW (ref >60)
GFR calc non Af Amer: 34 mL/min — ABNORMAL LOW (ref >60)
Glucose, Bld: 229 mg/dL — ABNORMAL HIGH (ref 70–99)
Sodium: 137 mEq/L (ref 135–145)
Sodium: 141 mEq/L (ref 135–145)

## 2011-01-11 LAB — CARDIAC PANEL(CRET KIN+CKTOT+MB+TROPI)
CK, MB: 2.7 ng/mL (ref 0.3–4.0)
CK, MB: 4 ng/mL (ref 0.3–4.0)
Relative Index: INVALID (ref 0.0–2.5)
Relative Index: INVALID (ref 0.0–2.5)
Total CK: 52 U/L (ref 7–177)
Total CK: 53 U/L (ref 7–177)
Total CK: 53 U/L (ref 7–177)
Troponin I: 0.5 ng/mL (ref 0.00–0.06)

## 2011-01-11 LAB — CBC
HCT: 40.5 % (ref 36.0–46.0)
Hemoglobin: 12.1 g/dL (ref 12.0–15.0)
MCH: 28.6 pg (ref 26.0–34.0)
MCHC: 33.8 g/dL (ref 30.0–36.0)
MCHC: 34 g/dL (ref 30.0–36.0)
Platelets: 187 10*3/uL (ref 150–400)
Platelets: 193 10*3/uL (ref 150–400)
Platelets: 228 10*3/uL (ref 150–400)
RBC: 4.48 MIL/uL (ref 3.87–5.11)
RDW: 14.3 % (ref 11.5–15.5)
RDW: 14.4 % (ref 11.5–15.5)
RDW: 14.5 % (ref 11.5–15.5)
WBC: 11.3 10*3/uL — ABNORMAL HIGH (ref 4.0–10.5)

## 2011-01-11 LAB — PROTIME-INR
INR: 1.17 (ref 0.00–1.49)
INR: 1.54 — ABNORMAL HIGH (ref 0.00–1.49)
Prothrombin Time: 14.8 seconds (ref 11.6–15.2)

## 2011-01-11 LAB — POCT CARDIAC MARKERS
CKMB, poc: 3.3 ng/mL (ref 1.0–8.0)
Troponin i, poc: 0.42 ng/mL (ref 0.00–0.09)

## 2011-01-11 LAB — D-DIMER, QUANTITATIVE: D-Dimer, Quant: 8.88 ug/mL-FEU — ABNORMAL HIGH (ref 0.00–0.48)

## 2011-01-11 LAB — APTT: aPTT: 25 seconds (ref 24–37)

## 2011-01-11 LAB — URINE MICROSCOPIC-ADD ON

## 2011-01-11 LAB — LIPASE, BLOOD: Lipase: 26 U/L (ref 11–59)

## 2011-01-11 LAB — LACTIC ACID, PLASMA: Lactic Acid, Venous: 1.9 mmol/L (ref 0.5–2.2)

## 2011-01-11 LAB — LIPID PANEL: Cholesterol: 114 mg/dL (ref 0–200)

## 2011-01-11 LAB — TSH: TSH: 2.07 u[IU]/mL (ref 0.350–4.500)

## 2011-03-14 NOTE — Discharge Summary (Signed)
NAME:  [name], [name]                 ACCOUNT NO.:  [id number]   MEDICAL RECORD NO.:  [id number]          PATIENT TYPE:  INP   LOCATION:  A210                          FACILITY:  [facility]   PHYSICIAN:  Mobolaji B. Bakare, M.D.DATE OF BIRTH:  1914/09/05   DATE OF ADMISSION:  03/01/2006  DATE OF DISCHARGE:  03/05/2006                                 DISCHARGE SUMMARY   FINAL DIAGNOSES:  1.  Altered mental status.  2.  Acute renal failure.  3.  Hypoglycemia.  4.  Bradycardia.  5.  Diabetes mellitus.  6.  History of congestive heart failure.  7.  Hypertension.   PROCEDURES:  1.  Two-D echocardiogram, done on Mar 03, 2006.  It showed ejection fraction      of 75%.  No wall motion abnormality.  Please see dictation for full      details.  2.  Head CT scan showed no acute abnormality.  There were chronic      microvascular changes.  3.  Chest x-ray showed no acute disease, cardiomegaly.  4.  Abdominal x-ray was unremarkable.  5.  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  There was no adrenal __________.   CONSULTS:  Cardiology consult, Dr. Dorethea Clan.   BRIEF HISTORY:  [name] is an 77 year old African-American female who  lives in a rest home.  She presented with inability to pass urine and low  blood sugar, altered mental status and acute renal failure.  Acute renal  failure was thought to be secondary to prerenal azotemia.  A Foley catheter  was placed in the emergency room which drained 60 mL of urine.  BUN was 29,  and creatinine was 1.6.  She was admitted for IV fluid hydration.   HOSPITAL COURSE:  1.  Altered mental status.  This was thought to be a combination of      hypoglycemia, acute renal failure, which will be discussed below.  2.  Acute renal failure.  This was thought to be prerenal.  The patient was      started on IV fluids, and over the course  of hospitalization, the      patient was having decreased output.  At the time of discharge, BUN has      improved to 23, and creatinine improved marginally to 1.5.  She does      have a baseline chronic renal insufficiency.  3.  Hypoglycemia.  This is thought to be a combination of worsening renal      insufficiency in a background of oral hyperglycemic agents.  These      medications were held temporarily, and the patient was monitored quite      closely.  Her blood glucose normalized.  She was restarted on previous      home medication when acute renal insufficiency improved.  This was      optimized gradually, and at the time of discharge, she was able  to      tolerate 45 mg of Actos.  4.  Elevated troponin.  The patient was seen in consultation by cardiology      for elevated troponin.  This was felt to be secondary to chronic renal      insufficiency.  She had an abnormal EKG, and an echocardiogram was      obtained which did not show any wall motion abnormality.  She had a      vigorous left ventricular function with ejection fraction of 75%.      Additional testing was not recommended.  5.  CHF.  This was stable, and the patient was compensated during the course      of hospitalization.  6.  Diabetes mellitus.  The patient was on Humulin N and Actos prior to      hospitalization.  During initial days of hospitalization, when these      medications were held, the patient's blood sugar subsequently rose to      the 200s.  She was gradually initiated on Lantus, and Actos was added      (the patient was not in clinical CHF).  Blood glucose continued to be      monitored, and she was able to tolerate Lantus 30 units subcu daily and      Actos 45 mg daily.  She was discharged home on these medications.  7.  Hypertension.  Lisinopril was held secondary to renal insufficiency.      Her blood pressure became elevated.  Norvasc 10 mg was introduced.  The      patient would clearly benefit  from lisinopril, but this was avoided      during this acute episode and hospitalization.  Lisinopril or ACE      inhibitor or ARB can be considered in the outpatient setting.   DISCHARGE CONDITION:  Stable.   DISCHARGE MEDICATIONS:  1.  Potassium chloride 20 mEq daily.  2.  Actos 45 mg daily.  3.  Zyrtec 10 mg daily.  4.  Torsemide 20 mg daily.  5.  Reglan 5 mg four times daily.  6.  Aspirin 81 mg daily.  7.  Lantus insulin 30 units subcu daily.  8.  Norvasc 10 mg daily.   DISCHARGE LABORATORY DATA:  Sodium 145, potassium 3.7, chloride 114, CO2 of  26, glucose 75, BUN 25, creatinine 1.5, calcium 9.3.      Mobolaji B. Corky Downs, M.D.  Electronically Signed     MBB/MEDQ  D:  03/08/2006  T:  03/09/2006  Job:  [id number]   cc:   Annia Friendly. Loleta Chance, MD  Fax: [phone]

## 2011-03-14 NOTE — Op Note (Signed)
NAMEBEVERELY, SUEN                             ACCOUNT NO.:  [id number]   MEDICAL RECORD NO.:  [id number]                   PATIENT TYPE:  OIB   LOCATION:  5703                                 FACILITY:  [facility]   PHYSICIAN:  Salley Scarlet., M.D.         DATE OF BIRTH:  Apr 30, 1914   DATE OF PROCEDURE:  DATE OF DISCHARGE:  03/31/2003                                 OPERATIVE REPORT   PREOPERATIVE DIAGNOSIS:  Immature cataract, left eye.   POSTOPERATIVE DIAGNOSIS:  Immature cataract, left eye.   OPERATION:  Extracapsular cataract extraction with intraocular lens  implantation.   SURGEON:  Marya Landry. Carlyle Lipa., M.D.   ANESTHESIA:  Local using Xylocaine 2% with Marcaine 0.75% and Wydase.   INDICATIONS:  This is a 77 year old lady who complains of blurring of vision  with difficulty seeing to read.  She was evaluated and found to have a  visual acuity of 20/100 each eye and a bilateral 3+ nuclear sclerotic  cataracts.  Cataract extraction of the left eye was recommended.  She is  admitted at this time for that purpose.   DESCRIPTION OF PROCEDURE:  Under the influence of IV sedation and Van Lint  akinesia, retrobulbar anesthesia were given.  The patient was prepped and  draped in the usual manner.  The lid speculum was inserted under the upper  and lower  lid of the right eye.  Then 4-0 silk traction sutures were passed  to the bellies of the rectus muscle for traction.  A fornix based  conjunctival flap was turned, and hemostasis achieved by using the cautery.  A groove incision was made around the limbus to approximately 120 degrees.  The anterior chamber was entered through this groove incision at the 11:30  o'clock position using the Superblade.  Occucoat was injected into the eye,  and an anterior capsulotomy was done through a bent 25 gauge needle.  The  corneoscleral wound was then entered and extended first to the left and then  to the right placing a segment of  8-0 Vicryl suture across each limb of the  incision.  __________ was made __________ toward the left and toward the  right.  The nucleus was then manually expressed from the eye.  The two  previously placed sutures were closed and a third one was placed at the 12  o'clock position.  The IA handpiece was fastened to the eye, and the  residual cortical material was aspirated.  The posterior capsule was  polished using the olive-tip polisher.  An oval posterior chamber lens was  then seated into the eye behind the iris without difficulty.  The anterior  chamber was reformed and the pupil constricted using Miochol.  The  corneoscleral wound was closed by using a combination of interrupted sutures  of 8-0 Vicryl and 10-0 nylon.  After we had ascertained that the wound was  airtight and water tight, the  conjunctiva was closed using thermocautery.  Then 1 mL of Celestone and 0.5 mL of gentamicin were injected  subconjuctivally. Maxitrol ophthalmic ointment and Pilopine ointment were  applied along with a patch and Fox shield.  The patient tolerated the  procedure well and was discharged to the post anesthesia care unit in  satisfactory condition.    DISPOSITION:  Because this lady lives alone and has a variety of medical  problems she will be admitted for overnight observation and discharged when  her condition is satisfactory.   DISCHARGE DIAGNOSIS:  Immature cataract, left eye.                                               Salley Scarlet., M.D.    TB/MEDQ  D:  04/01/2003  T:  04/02/2003  Job:  [id number]

## 2011-03-14 NOTE — H&P (Signed)
NAME:  [name], [name]                           ACCOUNT NO.:  [id number]   MEDICAL RECORD NO.:  [id number]                   PATIENT TYPE:  INP   LOCATION:  A228                                 FACILITY:  [facility]   PHYSICIAN:  Michaelyn Barter, M.D.              DATE OF BIRTH:  1914/08/15   DATE OF ADMISSION:  05/06/2004  DATE OF DISCHARGE:  05/07/2004                                HISTORY & PHYSICAL   CHIEF COMPLAINT:  Chest pain.   HISTORY OF PRESENT ILLNESS:  The patient is an 77 year old female with a  past medical history of noninsulin-dependent diabetes mellitus and  cardiovascular disease.  The patient's nephew is at her bedside.  He states  that the patient had a cyst removed over the left pectoral area a couple of  years ago.  He said that since then she gets a sharp pain from the same  location in which the cyst was previously.  She has been having this chest  pain off and on for approximately 2 years.  Today she had the pain earlier.  It was accompanied by nausea.  This started prior to 11 a.m.  She was  sitting watching TV when the pain began.  Chest pain does not travel down  the neck or down the arm.  The pain does not go to the back.  She denies  dyspnea on exertion.  She can walk long distances; however, sometimes chest  pain will occur.  No aggravating or alleviating factors were identified.   PRIMARY CARE PHYSICIAN:  Dr. Mirna Mires.   PAST MEDICAL HISTORY:  1. Noninsulin-dependent diabetes.  2. Congestive heart failure.   PAST SURGICAL HISTORY:  1. Cyst removed from pectoral area approximately 5 years ago.  2. Left eye is post cataract surgery.   ALLERGIES:  No known drug allergies.   SOCIAL HISTORY:  1. The patient lives in [facility] in [place].  2. Cigarettes - the patient denies.  3. Positive history of chewing tobacco.  4. Alcohol - the patient denies.  5. Street drugs - the patient denies.   FAMILY HISTORY:  Mother had no illnesses that the  patient can recall.  Father had a history of prostate cancer.   HOME MEDICATIONS:  1. Actos 45 mg p.o. daily.  2. Humulin NPH 45 units.  3. Lanoxin 0.125 mg daily.  4. KCl 40 mEq p.o. daily.  5. Torsemide 60 mg daily.  6. Darvocet-N 100.  7. Colace 100 mg p.o. b.i.d.   PHYSICAL EXAMINATION:  GENERAL:  The patient is lying on the stretcher.  She  is displaying no obvious distress.  The patient is alert and oriented to  person and place, but not the year.  VITAL SIGNS:  Temperature 97.7, blood pressure 132/63, heart rate 78,  respirations 22.  HEENT:  Anicteric.  Left eye post cataract surgery.  NECK:  Supple.  No  lymphadenopathy.  Positive JVP at the level of the  clavicle.  Good carotid upstroke bilaterally.  No carotid bruit auscultated.  CHEST:  The patient has the remains of a well-healed scar from previous cyst  removal.  The area is hard/indurated.  It is very sensitive to touch.  There  is no discharge from the site, no obvious sign of infection.  CARDIAC:  S1 and S2 positive for regular rate and rhythm.  No S3, no S4.  Positive systolic ejection murmur, grade 2/6.  Positive PMI that is  nondisplaced.  No parasternal heave.  RESPIRATORY:  Clear bilaterally.  No crackles, no wheezes.  ABDOMEN:  Soft, nontender, nondistended.  Positive bowel sounds.  EXTREMITIES:  Trace bilateral leg edema.  MUSCULOSKELETAL:  5/5 upper and lower extremity strength.  NEUROLOGIC:  Cranial nerves II-XII intact.   LABORATORY DATA:  Troponin I less than 0.05.  Hemoglobin 12.5, hematocrit  36.4, white count 7.2, platelets 228.  MCV of 81.6.  BUN 14, creatinine 1.3.  Calcium 9.6, digoxin 1.3.  BNP of 109.  Chest x-ray showed no evidence of  acute disease.  Positive cardiomegaly, positive cephalization.  Fibrin D-  dimer was positive.  Spiral CT scan was completed.  It was negative for  pulmonary embolism.  It showed some bilateral basilar atelectasis.  EKG  revealed normal sinus rhythm, poor R wave  progression.  Q wave was present  in the inferior limb leads.  Chronicity was not clear.  The patient has no  old EKG's.   PLAN:  The patient will be admitted into the hospital for closer  observation.   ASSESSMENT PLAN:  1. Chest pain.  The etiology is atypical in nature.  The pain is localized     to the previous surgical site, and is reproducible, suggesting previous     surgical site/musculoskeletal site/musculoskeletal component to her     current complaint of chest pain.  However, in light of EKG findings,     which show some obvious abnormalities, I plan to rule out the patient for     an MI.  I will follow troponin I x3, repeat EKG's provide aspirin and     pain medication.  Will consult cardiology for further evaluation.  2. Slightly elevated D-dimer.  CT scan has been completed, the results of     which are negative.  Will continue to monitor.  3. Diabetes.  Will continue the patient's home regimen of anti-diabetic     medication.  Will perform Accu-Cheks a.c. and q.h.s.  4. Elevated blood pressure.  The patient's blood pressure currently is     elevated, which suggests a history of undiagnosed hypertension.  Will     consider starting anti-hypertensive medication, lisinopril 10 mg once a     day.  5. Congestive heart failure.  The patient's congestive heart failure is     currently compensated.  Will monitor I&O's.  Will continue current home     medications.     ___________________________________________                                         Michaelyn Barter, M.D.   OR/MEDQ  D:  05/10/2004  T:  05/10/2004  Job:  [id number]

## 2011-03-14 NOTE — Consult Note (Signed)
NAME:  [name], [name]                           ACCOUNT NO.:  [id number]   MEDICAL RECORD NO.:  [id number]                   PATIENT TYPE:  INP   LOCATION:  A228                                 FACILITY:  [facility]   PHYSICIAN:  Thomas C. Wall, M.D.                DATE OF BIRTH:  [birth year]   DATE OF CONSULTATION:  05/07/2004  DATE OF DISCHARGE:                                   CONSULTATION   PRIMARY CARE PHYSICIAN:  Annia Friendly. Loleta Chance, M.D.   Followed here in the hospital by Dr. Roxan Hockey who is a hospitalista.   HISTORY OF PRESENT ILLNESS:  [name] is an 77 year old female with past  medical history significant for diabetes and hypertension who presented to  [facility] Emergency Room with complaint of pain at a previous  surgical site over the medial aspect of her right breast.  She reports  chronic pain since surgery with worsening of pain prompting her  presentation.  She reports since admission her pain has improved somewhat,  but, however, has not resolved.  She does note some mild shortness of breath  associated with this.  She denies any chest discomfort with exertion,  dyspnea on exertion, orthopnea, PND, nausea, vomiting or diaphoresis.   PAST MEDICAL HISTORY:  Cardiac catheterization in 1997 revealing normal  coronary arteries per discharge summary in 2001.  Admission in 2001 to  evaluate chest discomfort.  Adenosine Cardiolite was negative for ischemia  and showed normal ejection fraction.  Echocardiogram revealed moderate LVH  and moderate dilated left atrium and trace mitral regurgitation with an EF  of 65-70%.  The patient does have history of congestive heart failure.  She  is also status post left eye cataract surgery.  Status post cyst removal as  noted in the HPI, hypertension and osteoarthritis.   MEDICATIONS PRIOR TO ADMISSION:  1. Actos 45 mg daily.  2. Humulin NPH 45 units q.a.m.  3. Lanoxin 0.125 mg daily.  4. Kay Ciel  40 mEq daily.  5. Torasemide 60  mg daily.  6. Colace 200 mg daily.  7. Darvocet-N 100 p.r.n.  8. Cosopt eye drops b.i.d.  9. Travatan eye drops q.h.s.   MEDICATIONS IN THE HOSPITAL:  1. Aspirin 325 mg daily.  2. Lanoxin 0.125 mg daily.  3. Colace 200 mg daily.  4. Prinivil 10 mg daily.  5. Actos 45 mg daily.  6. K-Dur 20 mEq daily.  7. Demodex 60 mg daily.  8. Nitroglycerin as needed.  9. Darvocet as needed.   SOCIAL HISTORY:  [name] [place] group home [place]  She is a widow.  She has no children.  Tobacco use:  Positive for chewing  tobacco.  Negative for cigarettes.  No alcohol use.  No illicit drug use.  The patient walks without the assistance of a cane or a walker.  She follows  a diabetic diet.   FAMILY HISTORY:  Patient cannot remember most of her family.  She does know  that her father had prostate cancer.  She does not know of any coronary  disease in her family.   REVIEW OF SYSTEMS:  CONSTITUTIONAL:  No fevers or chills.  HEENT:  No sore  throat.  No nasal discharge.  No headaches.  SKIN:  No rashes.  CARDIOPULMONARY:  Refer to HPI.  GU:  No frequency, urgency or dysuria.  NEUROPSYCH:  Generalized weakness.  No numbness or depression.  MUSCULOSKELETAL:  No myalgias or arthralgias.  GI:  No nausea, vomiting or  diarrhea.  No bright red blood per rectum.  No abdominal  pain.  Occasional  constipation and occasional gastroesophageal reflux disease symptoms.  All  other systems reviewed were negative.   ALLERGIES:  No known drug allergies.   PHYSICAL EXAMINATION:  VITAL SIGNS:  Temperature 97.8, pulse 63,  respirations 20, blood pressure 148/68.  Oxygen saturation 92% on room air.  Weight 143.3.  CBG is 211.  GENERAL:  Elderly female in no acute distress.  HEENT:  Normocephalic, atraumatic.  Pupils equal, round and reactive to  light.  Extraocular movements are intact.  The left eye is status post  cataract surgery.  NECK:  Reveals referred murmur bilateral sides of the  neck.  No jugular  venous distention.  No lymphadenopathy.  CARDIOVASCULAR:  Regular  rate and rhythm.  2/6 systolic murmur noted at the  left upper sternal border, split S2.  Therefore not severe AS.  LUNGS:  Clear to auscultation bilaterally.  SKIN:  She does have a 2.5 cm surgical incision site at the medial aspect of  her right breast that is erythematous and tender to palpation.  Clinical  breast exam is deferred.  ABDOMEN:  Soft, nontender with active bowel sounds.  GU/RECTAL:  Deferred.  EXTREMITIES:  Revealed trace edema bilateral lower extremities.  Distal  pulses intact in all four extremities.  No joint deformity is noted.  NEUROLOGIC:  She is alert and oriented x2.  She knows person and place, not  to year.   Chest x-ray reveals no acute disease.  Chest CT is negative for PE.   Electrocardiogram reveals sinus bradycardia with significant first-degree AV  block at a rate of 57 beats/minute.  Normal axis.  PR interval is 370 msec.  Normal QR restoration and QTC.  She does have Q waves noted in leads III.  Nonspecific ST abnormalities and poor R wave progression.  No significant  change since EKG done in March 2001.   LABORATORY DATA:  White blood cell 7.2, hemoglobin 12.5, hematocrit 36.4,  platelets 228, sodium 138, potassium 3.6, chloride 102, C02 27, BUN 13,  creatinine 1.2, calcium 9.2, magnesium 2.0, phosphorus 3.1, D-dimer 1.34,  BNP 109, dig 1.3, point of care markers negative x3, troponin negative x2.   IMPRESSION:  1. Noncardiac chest pain, likely chest wall pain at the incision site.  2. First-degree AV block and sinus bradycardia.  3. Aortic sclerosis murmur, not severe considering split S2 on exam.  4. Hypertension.   PLAN:  [name] is felt to have noncardiac chest pain.  Therefore, no  further cardiac workup is necessary at this time.  She does have significant first-degree AV block and bradycardia.  Considering this and her normal LV  function would  discontinue digoxin.  Would repeat an EKG in 7 to 10 days to  ensure that her first-degree AV block is resolved.  If she continues to have  this would also consider discontinuation of the Cosopt eye drops considering  the ingredient of Timolol.   The patient was interviewed and examined by Dr. Valera Castle.  He agrees with  the above assessment and plan.  I appreciate this consult and will be happy  to follow this patient along with you.     ________________________________________  ___________________________________________  Jae Dire, P.A. [facility]                      Thomas C. Wall, M.D.   AB/MEDQ  D:  05/07/2004  T:  05/07/2004  Job:  [id number]

## 2011-03-14 NOTE — H&P (Signed)
[name], [name]                 ACCOUNT NO.:  [id number]   MEDICAL RECORD NO.:  [id number]          PATIENT TYPE:  INP   LOCATION:  A210                          FACILITY:  [facility]   PHYSICIAN:  Osvaldo Shipper, MD     DATE OF BIRTH:  [birth year]   DATE OF ADMISSION:  03/01/2006  DATE OF DISCHARGE:  LH                                HISTORY & PHYSICAL   PRIMARY CARE PHYSICIAN:  Dr. Mirna Mires.   ADMISSION DIAGNOSES:  1.  Altered mental status.  2.  Acute renal failure.  3.  Hypoglycemia.  4.  History of diabetes on insulin.  5.  History of coronary artery disease.  6.  History of congestive heart failure.   CHIEF COMPLAINT:  Inability to pass urine and low blood sugars.   HISTORY OF PRESENT ILLNESS:  The patient is an 77 year old African-American  female who lives in [facility], a local group home, however, she was  with her niece this morning and spent the last night also with her niece.  According to the niece, the patient was complaining of inability to pass  urine since this morning.  She was complaining of lower abdominal pain and  she would make frequent trips to the bathroom, but with no success.  The  niece got concerned and brought her to the emergency department.  The  patient appears to be mildly confused and unable to give me any history at  this time.  She mentioned that she does not have any abdominal pain any  more.  Apparently she mentioned some chest pain to her niece as well this  morning.  Currently she is denying any chest pain.  Denies any shortness of  breath at this time.  She says once the Foley catheter was placed, her pain  resolved.  The patient had about 60 mL of urine when the Foley was placed.   There is no history of any nausea, vomiting, diarrhea, in the last few days.  Her p.o. intake is always poor according to her nephew.  She has not been on  any antibiotics recently.   MEDICATIONS AT HOME:  Once again this list is based on the ED list,  I could  not get a medication list directly and these include:  1.  Potassium chloride 20 mEq daily.  2.  Actos 45 mg daily.  3.  Zyrtec 10 mg daily.  4.  Lisinopril 10 mg daily.  5.  Torsemide 20 mg daily.  6.  Reglan 5 mg four times a day.  7.  Humulin N 40 units subcu each morning.   ALLERGIES:  NO KNOWN DRUG ALLERGIES.   PAST MEDICAL HISTORY:  Has history of diabetes requiring insulin.  Has a  history of coronary artery disease with MI x2.  Cardiac catheterization last  was currently 1997, based on a consultation note from a cardiologist in  2005.  Apparently she had LVH and trace MR with EF of 65-70% however, she  does have history of CHF as well.  History of left eye cataract surgery.  SOCIAL HISTORY:  She lives in [facility] family care.  There is no history of any  smoking use, any alcohol use, or any other drug use.  She is a widow with no  children.  She used to chew tobacco in the past.  She is able to ambulate  without a cane or walker, however, she uses the assistance of home furniture  apparently.  She does not have a cane or a walker at home.   FAMILY HISTORY:  Really unknown at this time as the patient is unable to  provide any.  Based on previous records however, it appears that the  patient's father had prostate cancer, there is no history of coronary artery  disease in the family.   REVIEW OF SYSTEMS:  Unable to do because of the patient's mental status.   PHYSICAL EXAMINATION:  VITAL SIGNS:  Temperature 97.0, blood pressure  126/56, heart rate last recorded as 129, appears to be regular, respiratory  rate is about 18, saturations are 95% on room air.  GENERAL:  Elderly female who appears mild confused, however, in no distress.  HEENT:  There is no pallor, no icterus, oral mucous membranes are moist.  No  oral lesions are noted.  NECK:  Soft, supple.  LUNGS:  Clear to auscultation bilaterally.  CARDIOVASCULAR:  S1, S2 is normal, regular.  Actually slightly  tachycardic,  systolic murmur appreciated in the aortic area.  ABDOMEN:  Soft, nontender, nondistended, bowel sounds are present, no  masses, or organomegaly appreciated at this time.  EXTREMITIES:  Show mild edema bilaterally.  NEUROLOGIC:  The patient is alert, disoriented, there does not appear to be  any focal deficits.  Sometimes she tends to be a little bit drowsy.   LABORATORY DATA:  White count 8.0, hemoglobin 7.4, MCV 84, platelet count  183, 88% neutrophils noted, no bands reported.  Sodium 142, potassium 3.6,  chloride 106, bicarb 26, glucose 221, BUN 43, creatinine 2.8, her baseline  was 17 and 1.4, however, in 11/2005 BUN was 29, creatinine 1.6.  LFTs are  normal, albumin 3.2.  UA shows trace protein, 3-6 wbc's, many bacteria, but  negative for nitrite and leukocytes.   No imaging studies have been done.   IMPRESSION:  This is an 77 year old African-American female with history of  diabetes on insulin, coronary artery disease, congestive heart failure who  lives in a local group home, who presents with inability to void urine and  was found to have hypoglycemia in the emergency room.  She also appears to  have mild confusion at this time, and she also has acute likely acute renal  failure.   PLAN:  1.  Acute renal failure likely coming from prerenal azotemia.  She probably      has some chronic renal insufficiency as well.  When Foley was placed,      she passed over 60 mL of urine which was yellow in color.  I am not sure      how good her p.o. intake has been in the past few days.  Obstruction is      of course a possible reasons for her presentation.  I think we should      obtain an ultrasound of the renal system in the morning to rule out any      obstruction or hydronephrosis.  In the meantime we will give her IV      fluids to see if she makes any urine.  If she does  not make any urine in     the next few hours, we will need to have an emergent imaging study  done      and possibly call urology.  2.  Hypoglycemia.  It is possible that because of her renal insufficiency,      insulin accumulated in her body which caused these hypoglycemic events.      Her last reading on the CBG was more than 200.  I think it would be      prudent to observe this every one hour for the next six hours and then      change her over to every six hours and then start sliding-scale as      needed.  Obviously for now we will be holding her Actos.  3.  Confusion.  It is unclear if the patient has dementia or not.  I do not      see any focal deficits.  I am not sure if there is any history of falls      at her local group home or not.  Since we are not sure of her baseline,      I am going to go ahead and obtain a CAT scan of her head.  4.  Chest pain, again very nonspecific, at the most the patient probably has      just an episode of angina.  I am going to do cardiac enzymes on this      patient, get an EKG as she was slightly tachycardic.  Based on this, we      will determine further workup.  Chest x-ray will also be done.  5.  Abdominal pain, likely coming from her urinary retention.  Though she      passed only 60 mL of urine.  We will obtain an abdominal x-ray to rule      out any other etiologies.  If her pain persists, further imaging will be      considered.   For now we will hold her antihypertensives and her diuretics.   Further management decisions will be based on the results of initial testing  and the patient's response to treatment.      Osvaldo Shipper, MD  Electronically Signed     GK/MEDQ  D:  03/01/2006  T:  03/01/2006  Job:  [id number]   cc:   Annia Friendly. Loleta Chance, MD  Fax: [phone]

## 2011-03-14 NOTE — Procedures (Signed)
NAME:  [name], [name]                 ACCOUNT NO.:  [id number]   MEDICAL RECORD NO.:  [id number]          PATIENT TYPE:  AMB   LOCATION:  DAY                           FACILITY:  [facility]   PHYSICIAN:  Edward L. Juanetta Gosling, M.D.DATE OF BIRTH:  Dec 04, 1913   DATE OF PROCEDURE:  12/11/2005  DATE OF DISCHARGE:                                EKG INTERPRETATION   TIME:  1201 hours on December 11, 2005.   RESULTS:  The rhythm is sinus rhythm rate of 60s.  There is first-degree AV  block.  The axis is leftward, but does not meet criteria for left axis  deviation.  There is left atrial enlargement.  Q-waves are seen across all  the precordial leads which may indicate a previous anterior infarction and  clinical correlation is suggested.   IMPRESSION:  Abnormal electrocardiogram.      Edward L. Juanetta Gosling, M.D.  Electronically Signed     ELH/MEDQ  D:  12/11/2005  T:  12/11/2005  Job:  [id number]

## 2011-03-14 NOTE — Procedures (Signed)
NAMEVYOLET, SAKUMA                 ACCOUNT NO.:  [id number]   MEDICAL RECORD NO.:  [id number]          PATIENT TYPE:  INP   LOCATION:  A210                          FACILITY:  [facility]   PHYSICIAN:  Vida Roller, M.D.   DATE OF BIRTH:  Apr 24, 1914   DATE OF PROCEDURE:  03/03/2006  DATE OF DISCHARGE:                                  ECHOCARDIOGRAM   PRIMARY CARE PHYSICIAN:  Margaretmary Dys, M.D.   TAPE NUMBER:  [id number], tape count [id number].   PROCEDURE:  This is an elderly woman for LV systolic function.   TECHNICAL QUALITY:  Adequate.   M-MODE TRACING:  The aorta is 31 mm.   The left atrium is 37 mm.   The septum is 16 mm.   The posterior wall is 15 mm.   Left ventricular diastolic dimension is 35 mm.   Left ventricular systolic dimension is 25 mm.   2-D AND DOPPLER IMAGING:  The left ventricle is small with a small internal  cavity diameter.  There is vigorous LV systolic function, with an ejection  fraction greater than 75%.  There is moderate concentric left ventricular  hypertrophy, and no wall motion abnormalities were seen.  There does not  appear to be significant LV outflow obstruction.   The right ventricle is normal size with normal systolic function.   Both of the atria appear to be dilated but very mildly.   The aortic valve is sclerotic.  There is trivial insufficiency.  No stenosis  is seen.   The mitral valve has moderate annular calcification with mild regurgitation.  No stenosis is seen.   The tricuspid valve has mild regurgitation.   There is no pericardial effusion.      Vida Roller, M.D.  Electronically Signed     JH/MEDQ  D:  03/04/2006  T:  03/05/2006  Job:  [id number]   cc:   Margaretmary Dys, M.D.

## 2011-03-14 NOTE — Discharge Summary (Signed)
NAME:  [name], [name]                           ACCOUNT NO.:  [id number]   MEDICAL RECORD NO.:  [id number]                   PATIENT TYPE:  INP   LOCATION:  A228                                 FACILITY:  [facility]   PHYSICIAN:  Vania Rea, M.D.              DATE OF BIRTH:  26-Jun-1914   DATE OF ADMISSION:  05/06/2004  DATE OF DISCHARGE:  05/07/2004                                 DISCHARGE SUMMARY   PRIMARY CARE PHYSICIAN:  Annia Friendly. Hill, M.D.   DISCHARGE DIAGNOSES:  1. Painful keloid scar, right chest.  2. Congestive heart failure, compensated.  3. Diabetes mellitus, type 2.  4. Hypertension, newly diagnosed.  5. Moderate dementia.  6. Glaucoma.   DISPOSITION:  Discharged to [facility].   DISCHARGE CONDITION:  Stable.   DISCHARGE MEDICATIONS:  1. Actos 45 mg daily.  2. Humulin N 40 units daily (change in dose and drug).  3. Demadex 60 mg daily.  4. Lisinopril 10 mg daily.  5. Colace 200 mg daily.  6. Tylenol 1 gram 3-4 times daily.  7. K-Dur 20 mEq daily.  8. Darvocet-N 100 every eight hours p.r.n.  9. Stop Cosopt, stop digoxin.   HOSPITAL COURSE:  1. Please refer to the history and physical of May 06, 2004.  This is an 76-     year-old [place] American lady, a resident of [facility] who     has CHF- well controlled and diabetes also well controlled, who has a     remote history of a keloid scar on the right breast, status post excision     of a dermoid cyst many years ago.  The scar causes episodic pain.  The     patient says she has had the pain for some years now.  However, her     caregiver at the nursing home says this time she had a pain and the pain     persisted for a whole day, much longer than before.  The patient was     brought to the emergency room where she complained of a pressing chest     pain, and her EKG evaluation, by the admitting physician, there seemed to     be new EKG changes and Q waves compared to an EKG done some years  prior.     The patient was admitted to rule out MI and was evaluated by a [place]     cardiologist.  The patient was ruled out for myocardial infarction with     three sets of negative enzymes.  The cardiologist reviewed her EKG and     her echo and found her to be having non-cardiac chest pain; however, she     was having first degree A-V block and sinus bradycardia.  The patient has     a history of normal ejection fraction.  In view of this, cardiologist  recommended discontinuing the digoxin and the Cosopt and to repeat the     EKG in 7-10 days to ensure that the first degree A-V block has resolved.     The patient has had no further chest pain during this admission and has     not had any pain medication during this admission.  2. The patient was found to have elevated blood pressure on admission     170/100 and this morning it was 148/83.  She has, therefore, been started     on Lisinopril 10 mg daily.  3. The patient's caregiver gives a history of mid-morning hypoglycemia,     after taking Humulin 70/30, 45 units.  This is all her fasting blood     sugars usually in the region of 110.  She has to have a lot of glucose     and snacks mid-morning.  The patient then recommended to switch to     Humulin N at 40 units daily and it can be titrated up by her primary care     physician.  Eventually, this lady may be put completely on insulin and     the Actos may be discontinued.  Primary care physician to take final     decision on this.   FOLLOWUP:  Follow up with Dr. Loleta Chance in 1-2 weeks.  Repeat EKG in 1-2 weeks to  ensure first A-V block has resolved.     ___________________________________________                                         Vania Rea, M.D.   LC/MEDQ  D:  05/07/2004  T:  05/07/2004  Job:  [id number]

## 2011-03-14 NOTE — Procedures (Signed)
[facility]  Patient:    [name], [name] Visit Number: [id number] MRN: [id number]          Service Type: EMS Location: ED Attending Physician:  Hilario Quarry Dictated by:   Kari Baars, M.D. Proc. Date: 10/22/01 Admit Date:  10/22/2001 Discharge Date: 10/22/2001                            EKG Interpretations  The rhythm is a sinus rhythm with a rate in the 70s.  There is first degree AV block.  There are Q-waves anteriorly with some ST elevation which could indicate anterior infarction which may be acute but there are no reciprocal changes or ST changes anywhere else which makes this a bit less likely. Clinical correlation is suggested.  Abnormal electrocardiogram. Dictated by:   Kari Baars, M.D. Attending Physician:  Hilario Quarry DD:  10/24/01 TD:  10/24/01 Job: [id number]

## 2011-03-14 NOTE — Discharge Summary (Signed)
   NAMETREVA, HUYETT                             ACCOUNT NO.:  [id number]   MEDICAL RECORD NO.:  [id number]                   PATIENT TYPE:  OIB   LOCATION:  5703                                 FACILITY:  [facility]   PHYSICIAN:  Salley Scarlet., M.D.         DATE OF BIRTH:  [birth year]   DATE OF ADMISSION:  03/30/2003  DATE OF DISCHARGE:  03/31/2003                                 DISCHARGE SUMMARY   HISTORY OF PRESENT ILLNESS:  This 77 year old lady complained of blurring of  vision with difficulty seeing to read.  She was evaluated and found to have  a visual acuity of 20/100 each eye.  There were bilateral immature  cataracts.  She has a history of diabetes and heart disease.  Cataract  extraction of the left eye was recommended, and she was admitted for that  purpose.   HOSPITAL COURSE:  She was taken to the operating room where an uneventful  extracapsular cataract extraction was done without complications.  Because  she lives alone and has a variety of medical problems, the decision was made  to admit her for overnight observation.  Her hospital course has been  uneventful.  This morning, the patient is comfortable, the cornea is clear,  the anterior chamber was deep, and the wound is healed.  She is going to be  discharged with instructions to use Predforte drops on the left eye q.i.d.  along with Zymar drops one to the left eye q.i.d., and Acular eye drops one  to the left eye q.i.d.  She is instructed to see me in the office tomorrow  for further evaluation.   DISCHARGE DIAGNOSIS:  Immature cataract, left eye.                                               Salley Scarlet., M.D.    TB/MEDQ  D:  04/01/2003  T:  04/01/2003  Job:  [id number]

## 2011-03-14 NOTE — Consult Note (Signed)
[name], [name]                 ACCOUNT NO.:  [id number]   MEDICAL RECORD NO.:  [id number]          PATIENT TYPE:  INP   LOCATION:  A210                          FACILITY:  [facility]   PHYSICIAN:  Chester Bing, M.D. [facility] OF BIRTH:  [birth year]   DATE OF CONSULTATION:  03/02/2006  DATE OF DISCHARGE:                                   CONSULTATION   REFERRING PHYSICIAN:  Margaretmary Dys, M.D.  Primary care physician, Dr.  Loleta Chance.  Primary cardiologist formerly Dr. Daleen Squibb.   HISTORY OF PRESENT ILLNESS:  This is a 77 year old woman who is currently  able to provide only minimal historical information.  Review of the chart  suggests that she presented with decreased urinary output as noted by her  family.  She was also found to have confusion, malaise, abdominal discomfort  and chest discomfort.  She improved after insertion of a Foley, although  only a minimal amount of urine was returned.  Her urinalysis did not suggest  urinary tract infection, but she is receiving intravenous antibiotics.  She  presented with acute on chronic renal insufficiency that has improved with  intravenous normal saline.  She has known diabetes which has been  suboptimally controlled in hospital.  She was found to have elevated cardiac  markers prompting cardiology consultation.   [name] was previously a patient under Dr. Anola Gurney care for congestive heart  failure with normal left ventricular systolic function and normal coronary  angiography in 1997.  She has multiple cardiovascular risk factors including  hypertension and diabetes.  Lipid status is unknown at the present time.  Her last cardiac testing was in 2001, as far as we know.  Adenosine  Cardiolite was negative for ischemia.  Echocardiography showed moderate LVH  and left atrial enlargement with preserved LV systolic function.   PAST MEDICAL HISTORY:  As in HPI.  Otherwise, notable for osteoarthritis.   MEDICATIONS PRIOR TO ADMISSION:  1.  KCl  20 mEq daily.  2.  Actos 45 mg daily.  3.  Zyrtec 10 mg daily.  4.  Lisinopril 10 mg daily.  5.  Torsemide 20 mg daily.  6.  Reglan 5 mg four times a day.  7.  Humulin N 40 units q.a.m.   ALLERGIES:  No known drug allergies.   SOCIAL HISTORY:  Lives in a group facility; widowed with no children.  History of chewing tobacco; no excessive use of alcohol.   FAMILY HISTORY:  Sketchy; father died of carcinoma of prostate.   REVIEW OF SYSTEMS:  Unobtainable.   PHYSICAL EXAMINATION:  GENERAL:  Somewhat lethargic, but pleasant and  oriented elderly woman in no acute distress.  VITAL SIGNS:  Temperature is 98.1, heart rate 70 and regular, respirations  20, blood pressure 165/70.  Weight 169.  HEENT:  Bilateral arcus senilis, pupils equal, round, react to light, EOMs  full.  NECK:  Mild jugular venous distension; normal carotid upstrokes without  bruits.  ENDOCRINE:  No thyromegaly.  HEMATOPOIETIC:  No adenopathy.  LUNGS:  Bibasilar rales, more prominent on the left.  CARDIAC:  Normal S1, S2, modest  systolic ejection murmur; prominent  splitting of the first heart sound; laterally displaced PMI.  ABDOMEN:  Soft  and nontender; normal bowel sounds; no masses; no organomegaly.  EXTREMITIES:  1+ ankle edema; distal pulses intact.  Swelling of the left  hand and lower arm at the IV site.  NEUROMUSCULAR:  Moves all extremities equally; normal cranial nerves.  MUSCULOSKELETAL:  Some osteoarthritic changes of the knees.   LABORATORY DATA AND X-RAY FINDINGS:  EKG showed sinus rhythm with first-  degree AV block; right axis deviation, possible prior lateral myocardial  infarction or limb lead reversal; left atrial abnormality; delayed R wave  progression.  Chest x-ray showed cardiomegaly; chronically increased lung  markings without edema or definite congestive heart failure.   Laboratories notable for hemoglobin of 10.7.  Potassium 3.5, creatinine of  2.0, improved from 2.8 on admission, but  worse than creatinine of 1.4 in  August 2005.  Cardiac markers showing normal CPK and CPK-MB with elevated  troponin to a peak of 0.54 so far.  Urinalysis showed bacteria, but few  white cells.  I see no cultures pending.   IMPRESSION:  [name] has a nonspecific presentation with apparent  dehydration of uncertain etiology.  Her suboptimal diabetic control may have  contributed or may simply be secondary to stress.   RECOMMENDATIONS:  Actos is not an ideal medication for her in the setting of  edema and the history of congestive heart failure.  Alternatives should be  considered.   Her elevated troponins are consistent with acute on chronic renal  insufficiency.  Due to the abnormal EKG, an echocardiogram will be obtained.  The EKG will be repeated.  I doubt that additional testing or treatment will  be warranted.   She is not currently receiving antibiotics.  She does not appear to have had  a urinary tract infection.  Alternative diagnoses should be considered.   We greatly appreciate the request for consultation and will be happy to  reassess Ms. Kunkler' cardiac status and to follow her with you.      Zwingle Bing, M.D. [facility]  Electronically Signed     RR/MEDQ  D:  03/02/2006  T:  03/03/2006  Job:  [id number]
